# Patient Record
Sex: Male | Born: 1935 | Race: White | Hispanic: No | Marital: Married | Smoking: Former smoker
Health system: Southern US, Community
[De-identification: ages and names within clinical notes are randomized; demographics above are authoritative.]

## PROBLEM LIST (undated history)

## (undated) DIAGNOSIS — F0390 Unspecified dementia without behavioral disturbance: Secondary | ICD-10-CM

## (undated) DIAGNOSIS — J449 Chronic obstructive pulmonary disease, unspecified: Secondary | ICD-10-CM

## (undated) DIAGNOSIS — I714 Abdominal aortic aneurysm, without rupture: Secondary | ICD-10-CM

## (undated) DIAGNOSIS — I4891 Unspecified atrial fibrillation: Secondary | ICD-10-CM

## (undated) DIAGNOSIS — I10 Essential (primary) hypertension: Secondary | ICD-10-CM

## (undated) HISTORY — PX: CHOLECYSTECTOMY: SHX55

## (undated) HISTORY — PX: APPENDECTOMY: SHX54

## (undated) HISTORY — PX: BACK SURGERY: SHX140

## (undated) HISTORY — PX: EYE SURGERY: SHX253

## (undated) HISTORY — PX: KNEE ARTHROSCOPY: SHX127

## (undated) HISTORY — PX: JOINT REPLACEMENT: SHX530

## (undated) HISTORY — PX: EXPLORATION POST OPERATIVE OPEN HEART: SHX5061

## (undated) HISTORY — PX: OTHER SURGICAL HISTORY: SHX169

---

## 1998-02-07 ENCOUNTER — Other Ambulatory Visit: Admission: RE | Admit: 1998-02-07 | Discharge: 1998-02-07 | Payer: Self-pay | Admitting: Interventional Cardiology

## 1998-02-09 ENCOUNTER — Ambulatory Visit (HOSPITAL_COMMUNITY): Admission: RE | Admit: 1998-02-09 | Discharge: 1998-02-09 | Payer: Self-pay | Admitting: Interventional Cardiology

## 1998-11-03 ENCOUNTER — Ambulatory Visit (HOSPITAL_COMMUNITY): Admission: RE | Admit: 1998-11-03 | Discharge: 1998-11-03 | Payer: Self-pay | Admitting: Urology

## 1998-11-03 ENCOUNTER — Encounter: Payer: Self-pay | Admitting: Urology

## 1998-11-05 ENCOUNTER — Emergency Department (HOSPITAL_COMMUNITY): Admission: EM | Admit: 1998-11-05 | Discharge: 1998-11-05 | Payer: Self-pay | Admitting: Emergency Medicine

## 1998-11-05 ENCOUNTER — Encounter: Payer: Self-pay | Admitting: Urology

## 1998-12-08 ENCOUNTER — Ambulatory Visit (HOSPITAL_COMMUNITY): Admission: RE | Admit: 1998-12-08 | Discharge: 1998-12-08 | Payer: Self-pay | Admitting: Urology

## 1998-12-08 ENCOUNTER — Encounter: Payer: Self-pay | Admitting: Urology

## 1999-02-01 ENCOUNTER — Inpatient Hospital Stay (HOSPITAL_COMMUNITY): Admission: AD | Admit: 1999-02-01 | Discharge: 1999-02-07 | Payer: Self-pay | Admitting: Internal Medicine

## 1999-02-01 ENCOUNTER — Encounter: Payer: Self-pay | Admitting: Internal Medicine

## 1999-05-24 ENCOUNTER — Inpatient Hospital Stay (HOSPITAL_COMMUNITY): Admission: AD | Admit: 1999-05-24 | Discharge: 1999-05-30 | Payer: Self-pay | Admitting: *Deleted

## 1999-05-25 ENCOUNTER — Encounter: Payer: Self-pay | Admitting: *Deleted

## 1999-06-02 ENCOUNTER — Encounter: Admission: RE | Admit: 1999-06-02 | Discharge: 1999-08-31 | Payer: Self-pay | Admitting: *Deleted

## 1999-07-04 ENCOUNTER — Encounter (HOSPITAL_COMMUNITY): Admission: RE | Admit: 1999-07-04 | Discharge: 1999-10-02 | Payer: Self-pay | Admitting: Interventional Cardiology

## 1999-08-15 ENCOUNTER — Encounter: Admission: RE | Admit: 1999-08-15 | Discharge: 1999-11-13 | Payer: Self-pay | Admitting: Orthopedic Surgery

## 1999-10-09 ENCOUNTER — Encounter: Payer: Self-pay | Admitting: Emergency Medicine

## 1999-10-09 ENCOUNTER — Inpatient Hospital Stay (HOSPITAL_COMMUNITY): Admission: EM | Admit: 1999-10-09 | Discharge: 1999-10-12 | Payer: Self-pay | Admitting: Emergency Medicine

## 1999-10-11 ENCOUNTER — Encounter: Payer: Self-pay | Admitting: Emergency Medicine

## 1999-10-16 ENCOUNTER — Encounter (HOSPITAL_COMMUNITY): Admission: RE | Admit: 1999-10-16 | Discharge: 2000-01-14 | Payer: Self-pay | Admitting: Interventional Cardiology

## 1999-11-20 ENCOUNTER — Encounter: Admission: RE | Admit: 1999-11-20 | Discharge: 2000-02-18 | Payer: Self-pay | Admitting: Orthopedic Surgery

## 2000-01-15 ENCOUNTER — Encounter (HOSPITAL_COMMUNITY): Admission: RE | Admit: 2000-01-15 | Discharge: 2000-04-14 | Payer: Self-pay | Admitting: Interventional Cardiology

## 2000-02-19 ENCOUNTER — Encounter: Admission: RE | Admit: 2000-02-19 | Discharge: 2000-05-19 | Payer: Self-pay | Admitting: Orthopedic Surgery

## 2000-04-15 ENCOUNTER — Encounter (HOSPITAL_COMMUNITY): Admission: RE | Admit: 2000-04-15 | Discharge: 2000-07-14 | Payer: Self-pay | Admitting: Interventional Cardiology

## 2000-06-04 ENCOUNTER — Encounter: Admission: RE | Admit: 2000-06-04 | Discharge: 2000-09-02 | Payer: Self-pay | Admitting: Orthopedic Surgery

## 2000-07-15 ENCOUNTER — Encounter (HOSPITAL_COMMUNITY): Admission: RE | Admit: 2000-07-15 | Discharge: 2000-10-13 | Payer: Self-pay | Admitting: Interventional Cardiology

## 2000-09-03 ENCOUNTER — Encounter: Admission: RE | Admit: 2000-09-03 | Discharge: 2000-09-05 | Payer: Self-pay | Admitting: Orthopedic Surgery

## 2000-10-14 ENCOUNTER — Encounter (HOSPITAL_COMMUNITY): Admission: RE | Admit: 2000-10-14 | Discharge: 2001-01-12 | Payer: Self-pay | Admitting: Interventional Cardiology

## 2000-12-03 ENCOUNTER — Encounter: Admission: RE | Admit: 2000-12-03 | Discharge: 2000-12-09 | Payer: Self-pay | Admitting: Orthopedic Surgery

## 2001-01-13 ENCOUNTER — Encounter (HOSPITAL_COMMUNITY): Admission: RE | Admit: 2001-01-13 | Discharge: 2001-04-13 | Payer: Self-pay | Admitting: Interventional Cardiology

## 2001-03-11 ENCOUNTER — Encounter: Admission: RE | Admit: 2001-03-11 | Discharge: 2001-03-17 | Payer: Self-pay | Admitting: Orthopedic Surgery

## 2001-04-17 ENCOUNTER — Ambulatory Visit (HOSPITAL_COMMUNITY): Admission: RE | Admit: 2001-04-17 | Discharge: 2001-04-17 | Payer: Self-pay | Admitting: Gastroenterology

## 2001-04-17 ENCOUNTER — Encounter (INDEPENDENT_AMBULATORY_CARE_PROVIDER_SITE_OTHER): Payer: Self-pay | Admitting: *Deleted

## 2001-06-24 ENCOUNTER — Encounter: Admission: RE | Admit: 2001-06-24 | Discharge: 2001-06-30 | Payer: Self-pay | Admitting: Orthopedic Surgery

## 2001-06-30 ENCOUNTER — Encounter (HOSPITAL_COMMUNITY): Admission: RE | Admit: 2001-06-30 | Discharge: 2001-09-28 | Payer: Self-pay | Admitting: Interventional Cardiology

## 2001-10-01 ENCOUNTER — Encounter (HOSPITAL_COMMUNITY): Admission: RE | Admit: 2001-10-01 | Discharge: 2001-12-30 | Payer: Self-pay | Admitting: Interventional Cardiology

## 2001-10-07 ENCOUNTER — Encounter: Admission: RE | Admit: 2001-10-07 | Discharge: 2001-10-13 | Payer: Self-pay | Admitting: Orthopedic Surgery

## 2001-12-31 ENCOUNTER — Encounter (HOSPITAL_COMMUNITY): Admission: RE | Admit: 2001-12-31 | Discharge: 2002-03-31 | Payer: Self-pay | Admitting: Interventional Cardiology

## 2002-01-26 ENCOUNTER — Encounter (HOSPITAL_BASED_OUTPATIENT_CLINIC_OR_DEPARTMENT_OTHER): Admission: RE | Admit: 2002-01-26 | Discharge: 2002-02-13 | Payer: Self-pay | Admitting: Orthopedic Surgery

## 2002-03-03 ENCOUNTER — Encounter: Payer: Self-pay | Admitting: Emergency Medicine

## 2002-03-03 ENCOUNTER — Inpatient Hospital Stay (HOSPITAL_COMMUNITY): Admission: EM | Admit: 2002-03-03 | Discharge: 2002-03-04 | Payer: Self-pay | Admitting: Emergency Medicine

## 2002-04-01 ENCOUNTER — Encounter (HOSPITAL_COMMUNITY): Admission: RE | Admit: 2002-04-01 | Discharge: 2002-06-30 | Payer: Self-pay | Admitting: Interventional Cardiology

## 2002-04-15 ENCOUNTER — Ambulatory Visit (HOSPITAL_BASED_OUTPATIENT_CLINIC_OR_DEPARTMENT_OTHER): Admission: RE | Admit: 2002-04-15 | Discharge: 2002-04-15 | Payer: Self-pay | Admitting: Internal Medicine

## 2002-05-18 ENCOUNTER — Encounter (HOSPITAL_BASED_OUTPATIENT_CLINIC_OR_DEPARTMENT_OTHER): Admission: RE | Admit: 2002-05-18 | Discharge: 2002-05-22 | Payer: Self-pay | Admitting: Internal Medicine

## 2002-06-20 ENCOUNTER — Ambulatory Visit (HOSPITAL_BASED_OUTPATIENT_CLINIC_OR_DEPARTMENT_OTHER): Admission: RE | Admit: 2002-06-20 | Discharge: 2002-06-20 | Payer: Self-pay | Admitting: Internal Medicine

## 2002-07-01 ENCOUNTER — Encounter (HOSPITAL_COMMUNITY): Admission: RE | Admit: 2002-07-01 | Discharge: 2002-09-29 | Payer: Self-pay | Admitting: Interventional Cardiology

## 2002-08-24 ENCOUNTER — Encounter (HOSPITAL_BASED_OUTPATIENT_CLINIC_OR_DEPARTMENT_OTHER): Admission: RE | Admit: 2002-08-24 | Discharge: 2002-11-22 | Payer: Self-pay | Admitting: Internal Medicine

## 2002-10-01 ENCOUNTER — Encounter (HOSPITAL_COMMUNITY): Admission: RE | Admit: 2002-10-01 | Discharge: 2002-12-30 | Payer: Self-pay | Admitting: Interventional Cardiology

## 2002-11-30 ENCOUNTER — Encounter (HOSPITAL_BASED_OUTPATIENT_CLINIC_OR_DEPARTMENT_OTHER): Admission: RE | Admit: 2002-11-30 | Discharge: 2003-02-28 | Payer: Self-pay | Admitting: Internal Medicine

## 2002-12-31 ENCOUNTER — Encounter (HOSPITAL_COMMUNITY): Admission: RE | Admit: 2002-12-31 | Discharge: 2003-03-31 | Payer: Self-pay | Admitting: Interventional Cardiology

## 2003-01-05 ENCOUNTER — Encounter: Admission: RE | Admit: 2003-01-05 | Discharge: 2003-01-14 | Payer: Self-pay | Admitting: Orthopedic Surgery

## 2003-02-17 ENCOUNTER — Inpatient Hospital Stay (HOSPITAL_COMMUNITY): Admission: EM | Admit: 2003-02-17 | Discharge: 2003-02-18 | Payer: Self-pay

## 2003-02-18 ENCOUNTER — Encounter: Payer: Self-pay | Admitting: Interventional Cardiology

## 2003-03-04 ENCOUNTER — Encounter (HOSPITAL_BASED_OUTPATIENT_CLINIC_OR_DEPARTMENT_OTHER): Admission: RE | Admit: 2003-03-04 | Discharge: 2003-06-02 | Payer: Self-pay | Admitting: Internal Medicine

## 2003-04-01 ENCOUNTER — Encounter (HOSPITAL_COMMUNITY): Admission: RE | Admit: 2003-04-01 | Discharge: 2003-06-30 | Payer: Self-pay | Admitting: Interventional Cardiology

## 2003-06-10 ENCOUNTER — Encounter (HOSPITAL_BASED_OUTPATIENT_CLINIC_OR_DEPARTMENT_OTHER): Admission: RE | Admit: 2003-06-10 | Discharge: 2003-06-23 | Payer: Self-pay | Admitting: Internal Medicine

## 2003-07-02 ENCOUNTER — Encounter (HOSPITAL_COMMUNITY): Admission: RE | Admit: 2003-07-02 | Discharge: 2003-09-30 | Payer: Self-pay | Admitting: Interventional Cardiology

## 2003-10-02 ENCOUNTER — Encounter (HOSPITAL_COMMUNITY): Admission: RE | Admit: 2003-10-02 | Discharge: 2003-11-30 | Payer: Self-pay | Admitting: Interventional Cardiology

## 2003-10-07 ENCOUNTER — Encounter (HOSPITAL_BASED_OUTPATIENT_CLINIC_OR_DEPARTMENT_OTHER): Admission: RE | Admit: 2003-10-07 | Discharge: 2003-10-19 | Payer: Self-pay | Admitting: Internal Medicine

## 2004-01-11 ENCOUNTER — Encounter (HOSPITAL_BASED_OUTPATIENT_CLINIC_OR_DEPARTMENT_OTHER): Admission: RE | Admit: 2004-01-11 | Discharge: 2004-01-25 | Payer: Self-pay | Admitting: Internal Medicine

## 2004-02-04 ENCOUNTER — Emergency Department (HOSPITAL_COMMUNITY): Admission: EM | Admit: 2004-02-04 | Discharge: 2004-02-04 | Payer: Self-pay | Admitting: Emergency Medicine

## 2004-02-29 ENCOUNTER — Inpatient Hospital Stay (HOSPITAL_BASED_OUTPATIENT_CLINIC_OR_DEPARTMENT_OTHER): Admission: RE | Admit: 2004-02-29 | Discharge: 2004-02-29 | Payer: Self-pay | Admitting: Interventional Cardiology

## 2004-04-19 ENCOUNTER — Encounter (HOSPITAL_BASED_OUTPATIENT_CLINIC_OR_DEPARTMENT_OTHER): Admission: RE | Admit: 2004-04-19 | Discharge: 2004-05-05 | Payer: Self-pay | Admitting: Internal Medicine

## 2004-07-26 ENCOUNTER — Encounter (HOSPITAL_BASED_OUTPATIENT_CLINIC_OR_DEPARTMENT_OTHER): Admission: RE | Admit: 2004-07-26 | Discharge: 2004-08-11 | Payer: Self-pay | Admitting: Internal Medicine

## 2004-11-07 ENCOUNTER — Encounter (HOSPITAL_BASED_OUTPATIENT_CLINIC_OR_DEPARTMENT_OTHER): Admission: RE | Admit: 2004-11-07 | Discharge: 2004-11-29 | Payer: Self-pay | Admitting: Internal Medicine

## 2005-06-13 ENCOUNTER — Encounter: Admission: RE | Admit: 2005-06-13 | Discharge: 2005-06-13 | Payer: Self-pay | Admitting: Internal Medicine

## 2005-06-29 ENCOUNTER — Ambulatory Visit (HOSPITAL_COMMUNITY): Admission: RE | Admit: 2005-06-29 | Discharge: 2005-06-29 | Payer: Self-pay | Admitting: General Surgery

## 2005-07-02 ENCOUNTER — Ambulatory Visit (HOSPITAL_COMMUNITY): Admission: RE | Admit: 2005-07-02 | Discharge: 2005-07-02 | Payer: Self-pay | Admitting: General Surgery

## 2005-09-25 ENCOUNTER — Emergency Department (HOSPITAL_COMMUNITY): Admission: EM | Admit: 2005-09-25 | Discharge: 2005-09-25 | Payer: Self-pay | Admitting: Emergency Medicine

## 2005-10-11 ENCOUNTER — Emergency Department (HOSPITAL_COMMUNITY): Admission: EM | Admit: 2005-10-11 | Discharge: 2005-10-11 | Payer: Self-pay | Admitting: Emergency Medicine

## 2005-10-22 ENCOUNTER — Inpatient Hospital Stay (HOSPITAL_COMMUNITY): Admission: RE | Admit: 2005-10-22 | Discharge: 2005-10-23 | Payer: Self-pay | Admitting: General Surgery

## 2005-10-22 ENCOUNTER — Encounter (INDEPENDENT_AMBULATORY_CARE_PROVIDER_SITE_OTHER): Payer: Self-pay | Admitting: *Deleted

## 2006-09-11 ENCOUNTER — Ambulatory Visit (HOSPITAL_BASED_OUTPATIENT_CLINIC_OR_DEPARTMENT_OTHER): Admission: RE | Admit: 2006-09-11 | Discharge: 2006-09-11 | Payer: Self-pay | Admitting: Orthopedic Surgery

## 2006-09-27 IMAGING — CR DG CHEST 1V PORT
1 series · 1 of 1 positions shown · non-contrast
Comparison: 02/04/04.

CLINICAL DATA: Chest pain, short of breath. 
 PORTABLE CHEST ? 1 VIEW:

[view not recorded]
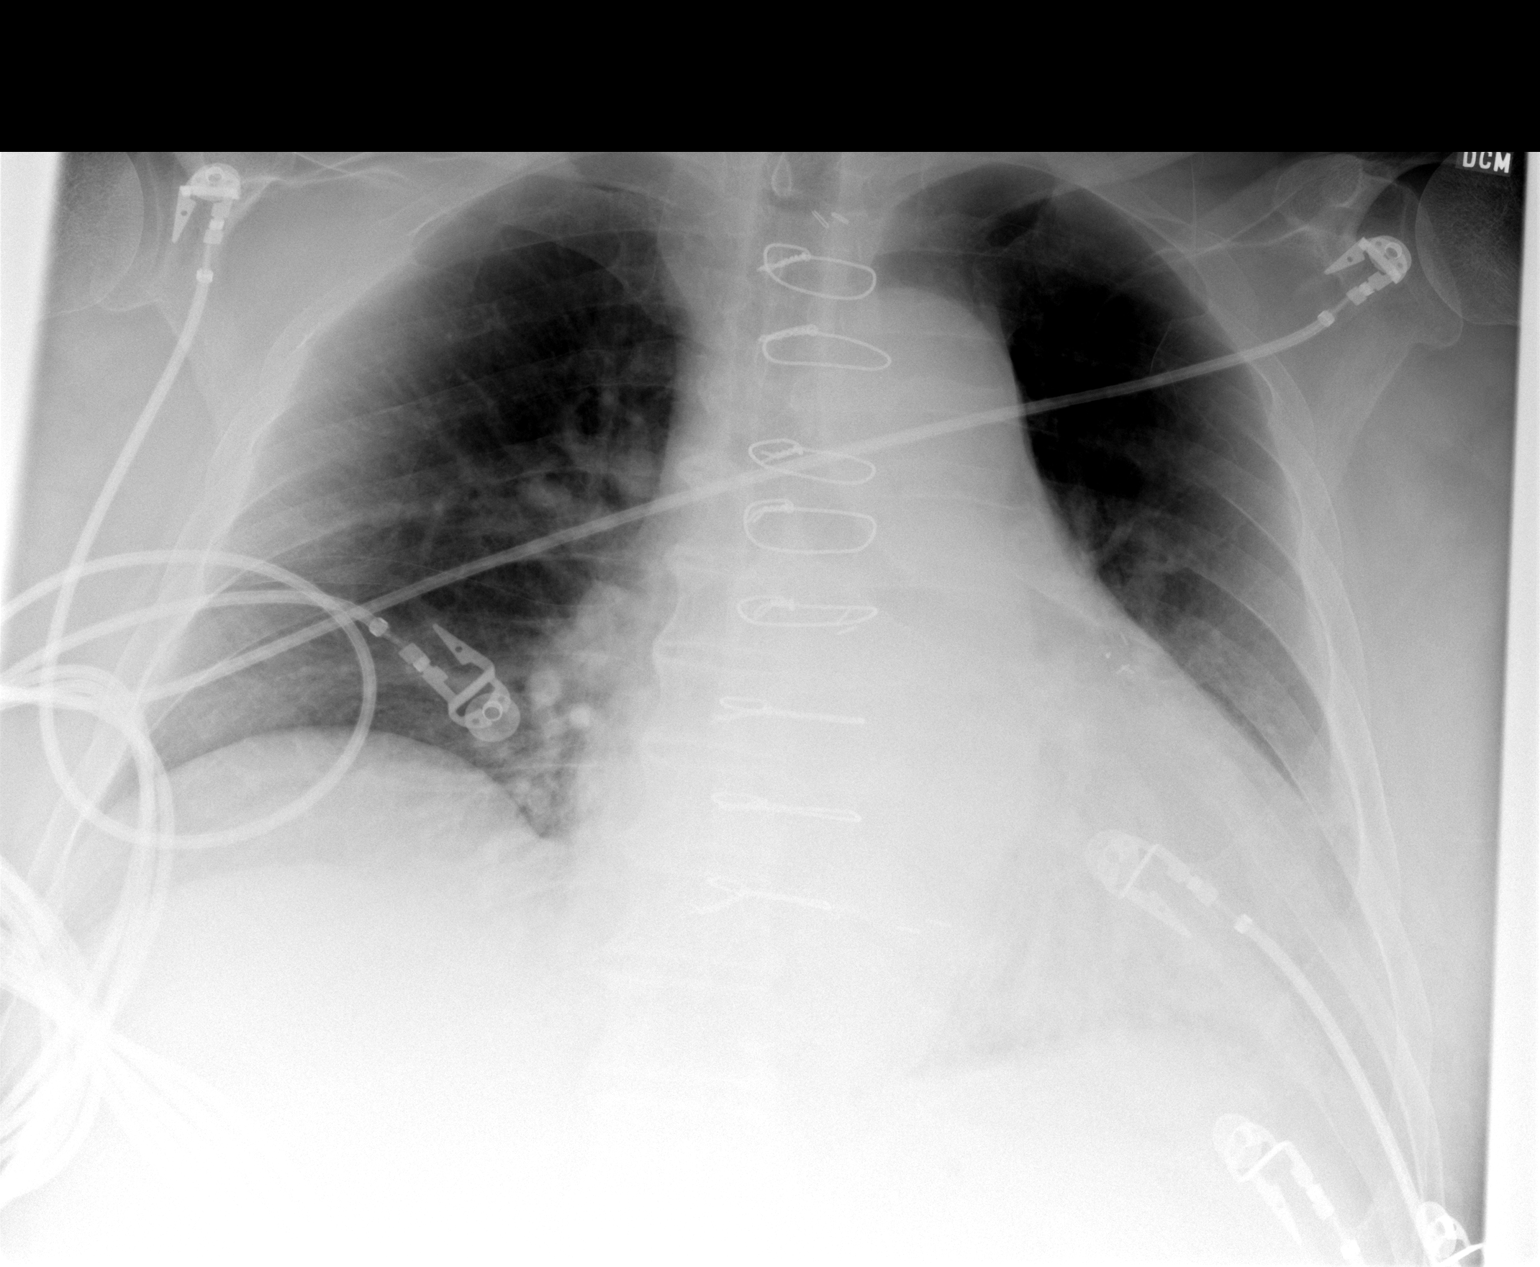

[1 of 1 positions shown; findings below may reference images not displayed]

FINDINGS: The heart is enlarged.  There may be venous hypertension, but no frank congestive heart failure.  Cannot rule out left lower lobe airspace disease seen through the left heart shadow.
IMPRESSION: 1.  Cardiomegaly with pulmonary venous hypertension.
 2.  Cannot rule out left lower lobe pneumonia.

## 2006-10-08 IMAGING — RF DG CHOLANGIOGRAM OPERATIVE
1 series · 4 of 4 positions shown · non-contrast
Comparison: None.

CLINICAL DATA: Cholecystitis. Laparoscopic cholecystectomy.

INTRAOPERATIVE CHOLANGIOGRAPHY 10/22/2005:

[Series 1: run · 4 of 131 frames shown]
[frame 20/131]
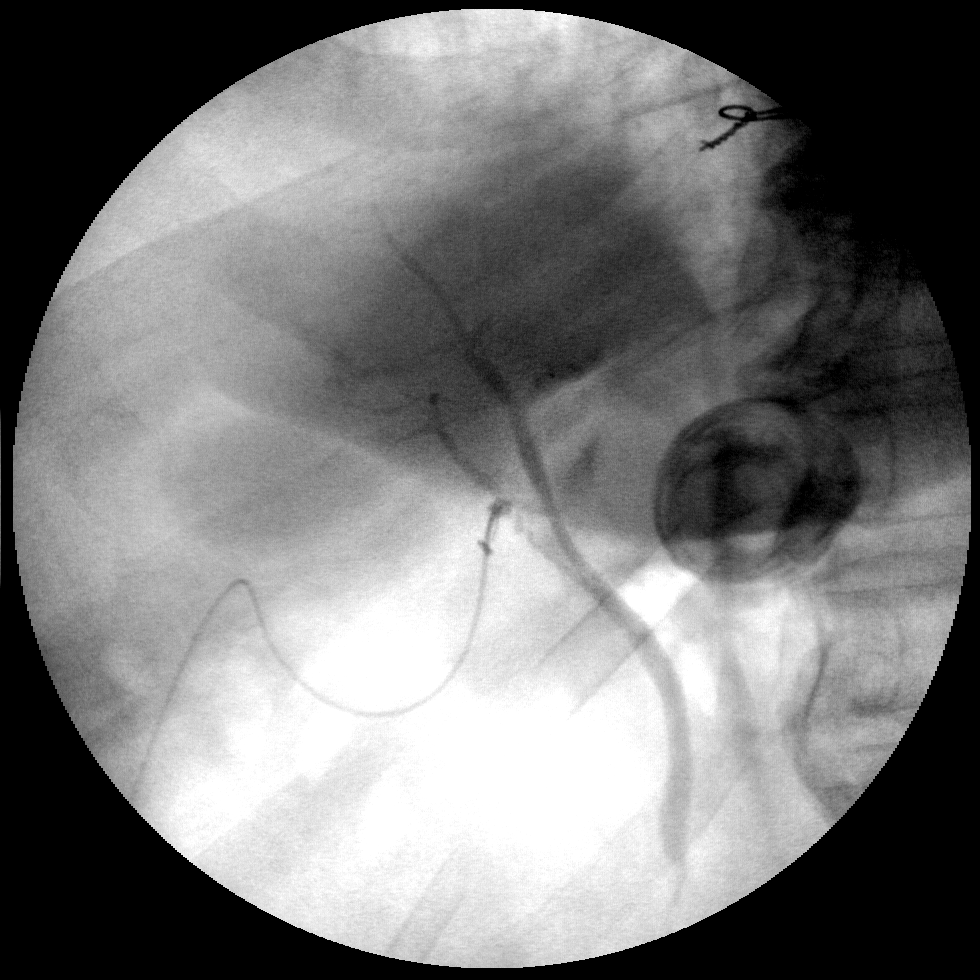
[frame 66/131]
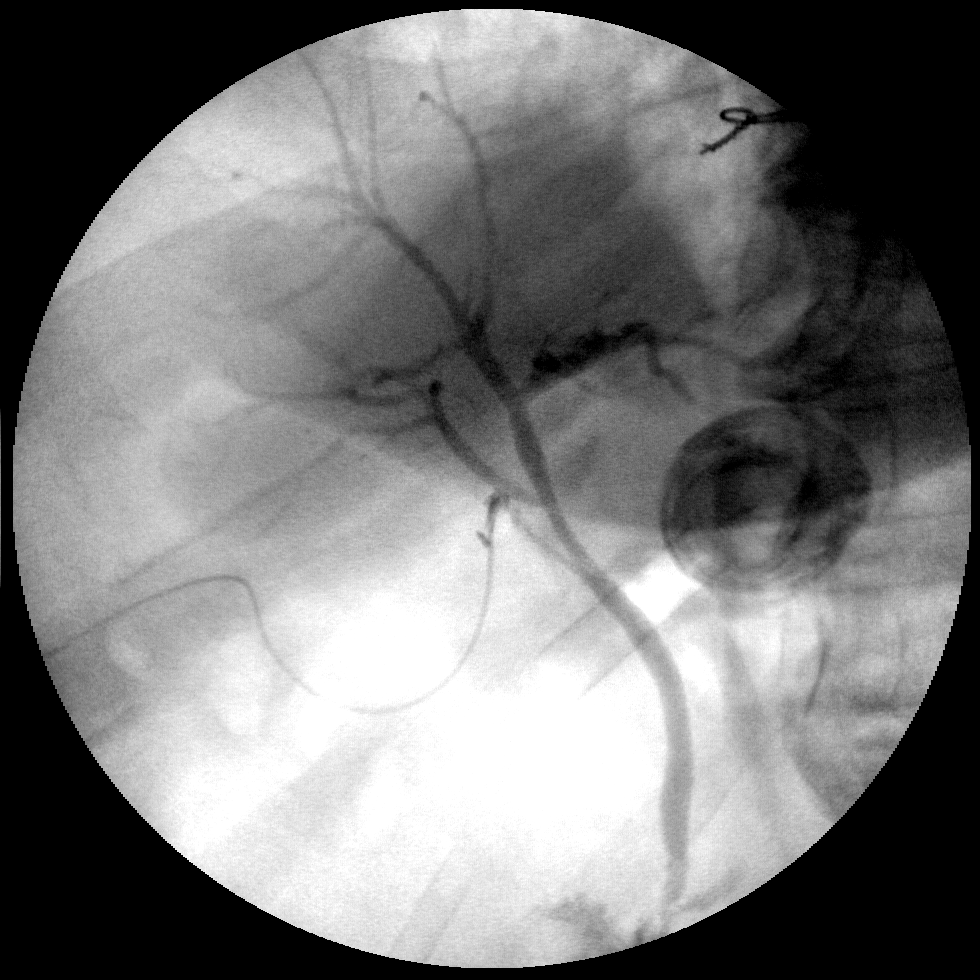
[frame 111/131]
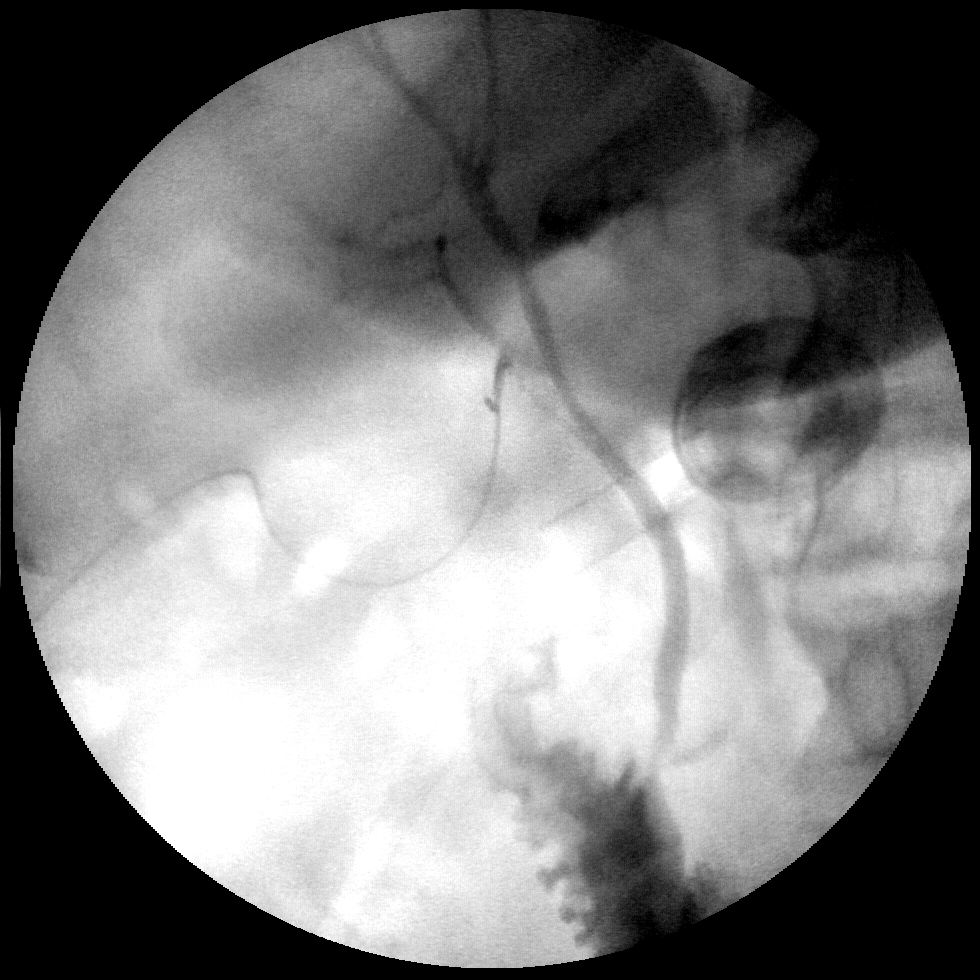
[frame 112/131]
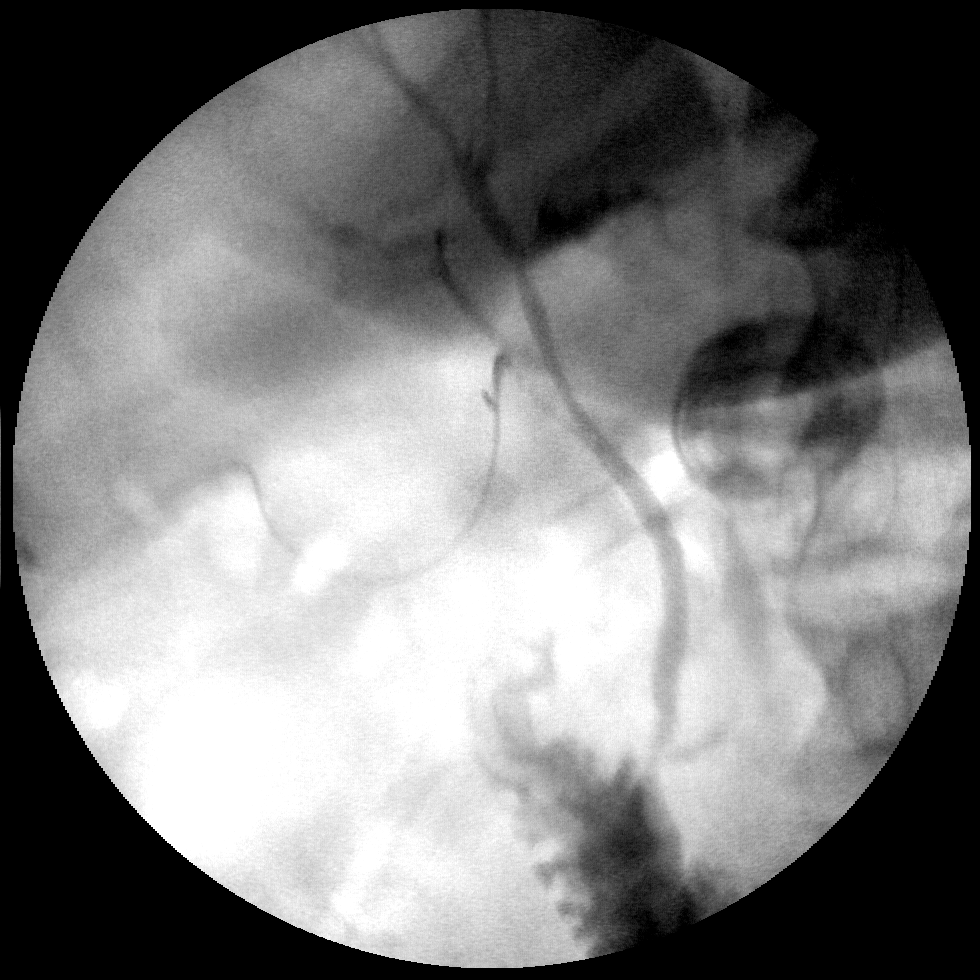

[4 of 4 positions shown; findings below may reference images not displayed]

FINDINGS: Multiple images from the C-arm fluoroscopic device are submitted for
interpretation postoperatively. There is a cannula in the cystic duct remnant.
There is excellent opacification of the common bile duct, common hepatic duct,
and proximal intrahepatic ducts. No filling defects are identified to suggest
retained stones. There is excellent antegrade flow into the duodenum.There is
variant bile duct anatomy in that the left intrahepatic ducts enter the right
anterior intrahepatic duct. The right posterior intrahepatic duct enters more
inferiorly in the common hepatic duct.
IMPRESSION: 1. No retained bile duct stones.
2. Variant bile duct anatomy as described.

## 2009-03-13 ENCOUNTER — Inpatient Hospital Stay (HOSPITAL_COMMUNITY): Admission: EM | Admit: 2009-03-13 | Discharge: 2009-03-15 | Payer: Self-pay | Admitting: Emergency Medicine

## 2010-10-22 ENCOUNTER — Encounter: Payer: Self-pay | Admitting: Internal Medicine

## 2011-01-08 LAB — GLUCOSE, CAPILLARY
Glucose-Capillary: 108 mg/dL — ABNORMAL HIGH (ref 70–99)
Glucose-Capillary: 109 mg/dL — ABNORMAL HIGH (ref 70–99)
Glucose-Capillary: 119 mg/dL — ABNORMAL HIGH (ref 70–99)
Glucose-Capillary: 124 mg/dL — ABNORMAL HIGH (ref 70–99)
Glucose-Capillary: 149 mg/dL — ABNORMAL HIGH (ref 70–99)
Glucose-Capillary: 174 mg/dL — ABNORMAL HIGH (ref 70–99)
Glucose-Capillary: 178 mg/dL — ABNORMAL HIGH (ref 70–99)

## 2011-01-08 LAB — CBC
HCT: 35.2 % — ABNORMAL LOW (ref 39.0–52.0)
Hemoglobin: 10.9 g/dL — ABNORMAL LOW (ref 13.0–17.0)
Hemoglobin: 11.3 g/dL — ABNORMAL LOW (ref 13.0–17.0)
MCHC: 32.2 g/dL (ref 30.0–36.0)
MCV: 83.7 fL (ref 78.0–100.0)
Platelets: 145 10*3/uL — ABNORMAL LOW (ref 150–400)
RBC: 4.2 MIL/uL — ABNORMAL LOW (ref 4.22–5.81)
RDW: 18.1 % — ABNORMAL HIGH (ref 11.5–15.5)
WBC: 6.4 10*3/uL (ref 4.0–10.5)
WBC: 9.4 10*3/uL (ref 4.0–10.5)

## 2011-01-08 LAB — DIFFERENTIAL
Eosinophils Relative: 1 % (ref 0–5)
Lymphocytes Relative: 10 % — ABNORMAL LOW (ref 12–46)
Monocytes Absolute: 0.6 10*3/uL (ref 0.1–1.0)
Monocytes Relative: 7 % (ref 3–12)
Neutrophils Relative %: 83 % — ABNORMAL HIGH (ref 43–77)

## 2011-01-08 LAB — BASIC METABOLIC PANEL
BUN: 13 mg/dL (ref 6–23)
CO2: 34 mEq/L — ABNORMAL HIGH (ref 19–32)
Chloride: 100 mEq/L (ref 96–112)
Chloride: 98 mEq/L (ref 96–112)
Creatinine, Ser: 1.04 mg/dL (ref 0.4–1.5)
GFR calc Af Amer: >60 mL/min (ref >60)
Glucose, Bld: 113 mg/dL — ABNORMAL HIGH (ref 70–99)
Sodium: 139 mEq/L (ref 135–145)

## 2011-01-08 LAB — URINALYSIS, ROUTINE W REFLEX MICROSCOPIC
Bilirubin Urine: NEGATIVE
Glucose, UA: NEGATIVE mg/dL
Hgb urine dipstick: NEGATIVE
Nitrite: NEGATIVE
Protein, ur: NEGATIVE mg/dL
Urobilinogen, UA: 1 mg/dL (ref 0.0–1.0)

## 2011-01-08 LAB — COMPREHENSIVE METABOLIC PANEL
Alkaline Phosphatase: 61 U/L (ref 39–117)
BUN: 13 mg/dL (ref 6–23)
CO2: 31 mEq/L (ref 19–32)
Calcium: 8 mg/dL — ABNORMAL LOW (ref 8.4–10.5)
Glucose, Bld: 140 mg/dL — ABNORMAL HIGH (ref 70–99)
Sodium: 137 mEq/L (ref 135–145)
Total Protein: 6.3 g/dL (ref 6.0–8.3)

## 2011-01-08 LAB — URINE CULTURE

## 2011-01-08 LAB — CULTURE, BLOOD (ROUTINE X 2): Culture: NO GROWTH

## 2011-01-08 LAB — HEMOGLOBIN A1C: Hgb A1c MFr Bld: 6.6 % — ABNORMAL HIGH (ref 4.6–6.1)

## 2011-02-13 NOTE — H&P (Signed)
NAME:  Glen Miller, Glen Miller NO.:  [id number]   MEDICAL RECORD NO.:  [id number]          PATIENT TYPE:  INP   LOCATION:  5011                         FACILITY:  [facility]   PHYSICIAN:  Hollice Espy, M.D.DATE OF BIRTH:  12-23-1927   DATE OF ADMISSION:  03/12/2009  DATE OF DISCHARGE:                              HISTORY & PHYSICAL   PRIMARY CARE PHYSICIAN:  Georgann Housekeeper, MD.   CARDIOLOGIST:  Lyn Records, M.D.   CHIEF COMPLAINT:  Shortness of breath and cough.   HISTORY OF PRESENT ILLNESS:  The patient is a 77 year old white male  with a past medical history of COPD, CHF, CAD and diabetes mellitus who  for the past 4 to 5 days has been having problems, complaining of a  nonproductive cough, worsening shortness of breath and weakness.  When  he came to the emergency room, he was noted to have a white count 9.4,  however with an 83% shift.  The rest of his labs were noted for a normal  BUN and creatinine, a glucose of 140 and albumin 2.8 and a temperature  of 102.4.  A chest x-ray was done which noted cardiomegaly, no signs of  congestive heart failure and questionable atelectasis versus of  infectious process in the right infrahilar region.  Because of the  patient's elevated temperatures, I suspected he had a pneumonia and he  was given a dose of IV Rocephin and Zithromax in the emergency room.  A  BNP was not checked. When I saw the patient, he was feeling better.  He  was still complaining of some shortness of breath and weakness.  Denied  any headaches, vision changes or dysphagia, no chest pain or  palpitations, no abdominal pain.  No hematuria, dysuria, constipation,  diarrhea focal extremity numbness, weakness or pain.  Complains of  severe chronic lower extremity swelling.   REVIEW OF SYSTEMS:  Otherwise negative.   PAST MEDICAL HISTORY:  1. History of COPD.  2. Diabetes mellitus.  3. GERD.  4. CAD.  5. CHF with preserved ejection fraction.  6. Early  signs of dementia.  7. Depression.  8. Peripheral neuropathy.   MEDICATIONS:  1. Norvasc 10 mg.  2. Vytorin 10/80 mg.  3. Aspirin 81 mg.  4. Spiriva 18 mcg inhaled daily.  5. Imdur 60 mg.  6. Hyzaar 100/25 mg.  7. Metformin 1000 mg b.i.d.  8. Toprol XL 25 mg.  9. Flomax 0.4 mg.  10.Namenda 8 mg.  11.Neurontin 400 mg.  12.Fluoxetine 20 mg.  13.Zyrtec 10 mg.  14.K-Dur 20 mg.  15.Prilosec 20 mg.   ALLERGIES:  No known drug allergies.   SOCIAL HISTORY:  No no current tobacco, alcohol or drug use.   FAMILY HISTORY:  Noncontributory.   PHYSICAL EXAMINATION:  VITALS:  On admission temperature 102.4, heart  rate 65, blood pressure 137/62, respirations 22, O2 saturation 95% on 5  liters, now down to 92% on 2 liters .  GENERAL:  He is alert and oriented x2.  HEENT: Normocephalic atraumatic.  Mucous membranes slightly dry.  He has  no carotid  bruits.  HEART:  Regular rate and rhythm, normal S1, S2.  LUNGS:  Decreased breath sounds throughout.  ABDOMEN:  Soft, nontender, obese, positive bowel sounds.  EXTREMITIES:  No clubbing, cyanosis, he is a 2+ pitting edema from the  knees down.   LABORATORY DATA:  White count 9.4, H and H 11 and 35, MCV 84, platelet  count 180, 83% neutrophils.  Urinalysis unremarkable.  Urine cultures,  blood cultures, BNP pending.  Sodium 137, potassium 3.7 chloride 98,  bicarb 31, BUN 13, creatinine 1, glucose 140.  LFTs are noted for  albumin of 2.8.  Chest x-ray notes suboptimal exam, limitation of left  lung base visually, cardiomegaly, no signs of CHF,  volume loss and  opacity in the right infrahilar region atelectasis versus early  infection.   ASSESSMENT AND PLAN:  1. Chronic obstructive pulmonary disease/pneumonia.  Treat with IV      antibiotics, oxygen, nebulizers.  No steroids indicated at this      time.  2. Diabetes mellitus.  Continue Lantus sliding scale.  3. Previous history of coronary artery disease.  Check a BNP, rule out       diastolic acute dysfunction.      Hollice Espy, M.D.  Electronically Signed     SKK/MEDQ  D:  03/13/2009  T:  03/13/2009  Job:  [id number]   cc:   Georgann Housekeeper, MD

## 2011-02-13 NOTE — Discharge Summary (Signed)
NAMESHELDON, SEM NO.:  [id number]   MEDICAL RECORD NO.:  [id number]          PATIENT TYPE:  INP   LOCATION:  5011                         FACILITY:  [facility]   PHYSICIAN:  Glen Housekeeper, MD      DATE OF BIRTH:  [birth year]   DATE OF ADMISSION:  03/12/2009  DATE OF DISCHARGE:  03/15/2009                               DISCHARGE SUMMARY   DISCHARGE DIAGNOSES:  1. Left lower lobe pneumonia.  2. Mild congestive heart failure.  3. History of coronary artery disease.  4. History of chronic obstructive pulmonary disease, oxygen dependent.  5. Hypertension.  6. Depression.  7. Osteoarthritis, bilateral knee pain.  8. Weakness.  9. Diabetes.  10.Neuropathy.  11.Sleep apnea.   MEDICATIONS ON DISCHARGE:  1. Cozaar 50 mg daily.  2. Spiriva once a day inhaler.  3. Lidoderm pain patch q.12 hours as needed.  4. Prozac 20 mg daily.  5. Vitamin B12 injections every month.  6. Mobic 7.5 mg daily.  7. Calcium 500 two times a day.  8. Magnesium 400 mg once a day.  9. Albuterol nebulizer 3-4 times a day as needed.  10.Toprol 25 mg 2 tablets daily.  11.Flomax 0.4 mg daily.  12.Neurontin 400 mg 3 times a day.  13.Vytorin 10/80 one daily.  14.Prilosec 20 mg daily.  15.Imdur 60 mg daily.  16.Zyrtec 10 mg daily p.r.n.  17.Norvasc 10 mg half a tablet daily.  18.Potassium 20 mEq 2 tablets daily.  19.Lasix 40 mg daily.  20.CPAP at night.  21.Oxygen 2-3 L nasal cannula.  22.Ventolin MDI as needed.  23.Avelox 400 mg once a day for 10 days.  24.Lantus insulin 20 units at night.  25.Namenda 5 mg b.i.d.  26.Darvocet 100 q.6 p.r.n. for pain.   MEDICATIONS STOPPED:  Hyzaar and Avandia.   LABORATORY DATA:  Potassium 3.4, sodium 139, creatinine 1.05.  White  count of 6.4, hemoglobin 10.9.  The blood cultures remained negative.  BNP initially was 980, came down to 650.  A1c 6.6.  Chest x-ray showed  left lower lobe pneumonia, mild vascular congestion, COPD.   HOSPITAL COURSE:   A 77 year old male with multiple medical issues,  admitted after he came in with some shortness of breath and congestion  with elevated white count, and the patient was at vacation for about a  week when he got sick.   PROBLEMS:  1. Pulmonary.  The patient had left lower lobe infiltrate, started on      antibiotics, Avelox IV.  His white count came down to 6.4, switched      to p.o. Avelox for total of 10 days.  His oxygen remained stable.      He has history of severe chronic obstructive pulmonary disease,      oxygen dependent, continues to require oxygen and nebulizer and      Spiriva, continue on that.  2. Cardiac.  The patient with history of coronary artery disease, had      mildly elevated BNP, given IV Lasix with improvement.  Lasix was      increased to 40 mg  daily from 20 and potassium replacement.  His      Avandia will be discontinued because of his mild congestive heart      failure decrease.  Increase the Lantus insulin and continue on the      metformin, and as far as his cardiac markers were negative, he      remained in normal sinus rhythm on telemetry.  3. Type 2 diabetes.  He was on metformin, Avandia, and Lantus.  We      will discontinue the Avandia because of congestive heart failure.      Continue metformin.  Increase Lantus to 20 units.  4. Hypertension.  We will stop the Hyzaar because of the Lasix      increasing and switch him to Cozaar 50 mg and decrease the Norvasc      to 5 mg and Lasix 40 mg daily.  As far as his neuropathy, continue      on his Neurontin.  Depression stable on Prozac.  I will see him      back in 1 week at the office.  As far as his discomfort in the knee      and with difficulty ambulation, the patient had osteoarthritis on      the x-ray of the left knee.  Continue on Mobic as well as we will      add Darvocet p.r.n. for pain.  Physical Therapy was consulted.      They recommended getting a short-term SNF versus Home Health with       physical therapy, and family decided to go with Home Health, and      physical therapy at home will be arranged, and we will monitor his      progress.  I will see him back in 1 week.      Glen Housekeeper, MD  Electronically Signed     KH/MEDQ  D:  03/15/2009  T:  03/15/2009  Job:  [id number]

## 2011-02-16 NOTE — Procedures (Signed)
[facility]  Patient:    JASPAL, PULTZ                         MRN: [id number] Proc. Date: 04/17/01 Attending:  Verlin Grills, M.D. CC:         Tyson Dense, M.D.   Procedure Report  PROCEDURE PERFORMED:  Surveillance colonoscopy with polypectomy.  ENDOSCOPIST:  Verlin Grills, M.D.  INDICATIONS:  Mr. Egor Fullilove (date of birth November 22, 2027) is a 77 year old male who is due for his first surveillance colonoscopy with polypectomy to prevent colon cancer.  I discussed with Mr. Ewbank the complications associated with colonoscopy and polypectomy including a 15 per 1000 risk of bleeding and 4 per 1000 risk of colon rupture requiring emergency surgery.  Mr. Tierce has signed the operative permit.  PREMEDICATIONS:  Versed 7 mg and Demerol 50 mg.  ENDOSCOPE:  Olympus pediatric colonoscope.  PROCEDURE PERFORMED:  After obtaining informed consent, Mr. Phillis was placed in the left lateral decubitus position.  I administered intravenous Versed and intravenous Demerol to achieve conscious sedation for the procedure.  The patients blood pressure, oxygen saturation, and cardiac rhythm were monitored throughout the procedure and documented in his medical record.  Anal inspection was normal.  Digital rectal exam was normal.  I did not get an adequate exam of the prostate.  The Olympus pediatric video colonoscope was introduced into the rectum and advanced to the cecum.  A normal appearing ileocecal valve was intubated and the distal ileum inspected.  Colonic preparation for the exam today was excellent.  From the mid rectum, a 1 mm sessile polyp was removed with hot biopsy forceps.  Sigmoid colon and descending colon showed extensive left colonic diverticulosis.  Splenic flexure normal.  Transverse colon from the mid transverse colon a 0.5 mm sessile polyp was removed with the cold biopsy forceps.  Hepatic flexure normal.  Ascending colon normal.   Cecum and ileocecal valve normal.  Distal ileum normal.  ASSESSMENT: 1. Extensive left colonic diverticulitis. 2. From the mid rectum, a 0.5 mm sessile polyp was removed with    cold biopsy forceps.  From the mid rectum, a 1 mm sessile    polyp was removed with the hot biopsy forceps.  The polyps    were submitted in one bottle for pathological evaluation.  RECOMMENDATIONS:  If colon polyps return neoplastic, Mr. Murtha should undergo a repeat colonoscopy in approximately five years.  If the polyps return nonneoplastic, Mr. Prehn should undergo a repeat surveillance colonoscopy in approximately 10 years. DD:  04/17/01 TD:  04/17/01 Job: [id number]

## 2011-02-16 NOTE — Op Note (Signed)
NAME:  Glen Miller, Glen Miller                ACCOUNT NO.:  [id number]   MEDICAL RECORD NO.:  [id number]          PATIENT TYPE:  INP   LOCATION:  1616                         FACILITY:  [facility]   PHYSICIAN:  Timothy E. Earlene Plater, M.D. DATE OF BIRTH:  05/11/1928   DATE OF PROCEDURE:  10/22/2005  DATE OF DISCHARGE:                                 OPERATIVE REPORT   PREOPERATIVE DIAGNOSES:  Symptomatic chronic cholecystolithiasis, known  ventral hernia.   POSTOPERATIVE DIAGNOSES:  Symptomatic chronic cholecystolithiasis, known  ventral hernia.   PROCEDURE:  Laparoscopic cholecystectomy with operative cholangiogram.   SURGEON:  Timothy E. Earlene Plater, M.D.   ASSISTANT:  Ollen Gross. Carolynne Edouard, M.D.   ANESTHESIA:  General.   Mr. Babington is under the constant care of Dr. Donette Larry, Dr. Katrinka Blazing, Dr.  Patsi Sears for known pulmonary cardiac diabetes care. He presents with well  known gallstones and increasing symptoms. He was also found to have a  ventral hernia that was thought to be primary. After careful consideration,  cardiac evaluation, and because of symptomatic gallstones as well as  intermittently elevated liver function studies, he is ready to proceed with  his surgery. He was seen and evaluated in the preop area, he was identified,  permit signed.   The patient was taken to the operating room, placed supine, general  endotracheal anesthesia administered. The abdomen was clipped, prepped and  draped in the usual fashion. Marcaine 0.25% with epinephrine was used prior  to each incision. A vertical incision made below the umbilicus, fascia  identified and the peritoneum entered without complications. The hernia felt  to be superior to the umbilicus. Hasson catheter placed, tied in place and  abdomen insufflated. Gentle peritoneoscopy was unremarkable except for  omental adhesions to the gallbladder and omentum incarcerated in a primary  supraumbilical hernia. A second 10 mm gastric trocar placed in the  epigastrium and two 5 mm trocars in the right upper quadrant. The  gallbladder was grasped, the omental adhesions taken down bluntly and the  gallbladder inspected in its entirety. Careful dissection at the base of the  gallbladder revealed a single apparent cystic duct and a single apparent  cystic artery. These were dissected free, a complete window created, a clip  placed at the gallbladder side of the cystic duct, cystic duct opened and  catheter placed percutaneously and a real-time cholangiogram made using half  strength Hypaque. There was immediate filling of the biliary tree with quick  emptying of dye into the duodenum. We inspected that carefully and thought  it was normal. We then reinflated the abdomen, visualized everything,  retracted the gallbladder, removed the clip, removed the catheter and then  triply clipped the stump of the cystic duct, divided it, triply clipped the  artery, divided it and then removed the gallbladder from the gallbladder bed  without incident or complications. Some bile spilled at the very top of the  gallbladder. No stones were seen to be spilled. The gallbladder was placed  in an EndoCatch bag and then copious irrigation carried out, thorough  inspection of the gallbladder bed. No complications were noted. We  were able  to visualize the omentum incarcerated in the primary supraumbilical hernia  from the upper 10 mm scope. I could not reduce the incarcerated omentum and  we decided that because of the type case of the gallbladder, i.e. dirty  case, and a diabetic that we would not make a separate incision, that it  could not be repaired laparoscopically and we could not use mesh. So we  elected not to repair this hernia at this time as it is apparently  asymptomatic. All counts were correct,  irrigant clear, CO2 and irrigant removed. Instruments and trocars removed  under direct vision. Each wound inspected and closed with 3-0 Monocryl.  Steri-Strips  applied. Final counts correct. He tolerated it well and was  removed to the recovery room in good condition.      Timothy E. Earlene Plater, M.D.  Electronically Signed     TED/MEDQ  D:  10/22/2005  T:  10/22/2005  Job:  [id number]

## 2011-02-16 NOTE — Cardiovascular Report (Signed)
NAME:  Glen Miller, Glen Miller NO.:  [id number]   MEDICAL RECORD NO.:  [id number]                   PATIENT TYPE:  OIB   LOCATION:  6501                                 FACILITY:  [facility]   PHYSICIAN:  Lesleigh Noe, M.D.            DATE OF BIRTH:  09/09/1928   DATE OF PROCEDURE:  02/29/2004  DATE OF DISCHARGE:                              CARDIAC CATHETERIZATION   INDICATION FOR PROCEDURE:  Recent increasing exertional symptoms of dyspnea,  fatigue and some chest tightness.  Recent Cardiolite study on Feb 14, 2004,  demonstrated a subtle anteroapical ischemia, probable mild inferior wall  ischemia, EF 59%.  This study is being done to define coronary anatomy.   PROCEDURE PERFORMED:  1. Left heart catheterization.  2. Selective coronary angiogram.  3. Left ventriculography.  4. Saphenous vein graft angiography.  5. Internal mammary graft angiography.   DESCRIPTION:  After informed consent, a 5 French sheath was placed in the  right femoral artery using a modified Seldinger technique.  A 5 French B2  multipurpose catheter was used for hemodynamic recordings, left  ventriculography by hand injection, selective left and right coronary  angiography and saphenous vein graft angiography.  A 5 French internal  mammary catheter was used for left internal mammary artery angiography.  The  patient tolerated the procedure without significant complication.   RESULTS:  A. Hemodynamic data:     a. Aortic pressure 122/63.     b. Left ventricular pressure 123/7 mmHg.   A. Left ventriculography:  Left ventricle is normal in size and demonstrates normal overall  contractility.  EF is 60%, no MR.   A. Coronary angiography:     a. Left main coronary:  Free of any significant obstruction.     b. Left anterior descending coronary:  Totally occluded proximally after        the first septal perforator.  The remainder of the LAD is supplied by        a saphenous vein  graft to the diagonal and LIMA to the LAD.     c. Circumflex artery:  The circumflex coronary artery is the dominant        vessel.  It gives rise to several large obtuse marginal branches.  The        first obtuse marginal branch fiburcates on the high lateral wall.  The        second obtuse marginal is relatively small.  The third obtuse marginal        is relatively small.  The fourth obtuse marginal is large.  The fifth        obtuse marginal bifurcates from the inferolateral wall.  There is 50%        narrowing after the second obtuse marginal.  The distal circumflex        supplies collaterals to the PDA and distal right coronary territory  which is totally occluded proximally.  No significant obstructive        lesions are noted in the major obtuse marginal branches.  The fifth        obtuse marginal, which is small, contains diffuse proximal vessel        disease.     d. Right coronary artery:  Totally occluded proximally.   A. Bypass graft angiography:     a. Saphenous vein graft to the diagonal:  This graft is smooth, large and        widely patent.  Proximal to the graft insertion site of the first        diagonal is an eccentric 60-70% narrowing.  There is retrograde        filling of the LAD from the saphenous vein graft to the first        diagonal.  There is moderate diffuse disease of the mid and distal        LAD.  There is also retrograde filling of the LIMA with competitive        flow in the LIMA noted.  The first __________ has been noted.     b. Saphenous vein graft to the right coronary:  Totally occluded.     c. LIMA to the LAD:  The LIMA graft to the LAD contained proximal        irregularities with up to 40-50% narrowing.  The graft is patent and        keeps the mid LAD with competitive flow, fighting against the diagonal        that is filling the LAD retrograde via saphenous vein graft.   CONCLUSIONS:  1. Severe native vessel coronary disease with  total occlusion of proximal     left anterior descending, total occlusion of proximal right coronary     artery.  There is mild to moderate circumflex disease with no significant     obstruction noted in the circumflex branches.  2. 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  There is diffuse disease of the left internal     mammary artery to the left anterior descending, but this graft is patent     but somewhat atretic.  The saphenous vein graft to the diagonal, which     also __________ the left anterior descending by retrograde effusion is     widely patent.  3. Left ventricular function overall was normal, ejection fraction was     estimated to be 60%.   PLAN:  Continue medical therapy.                                               Lesleigh Noe, M.D.    HWS/MEDQ  D:  02/29/2004  T:  02/29/2004  Job:  [id number]   cc:   Georgann Housekeeper, M.D.  [address]  [place]  [place] [address]  Fax: [phone]

## 2011-02-16 NOTE — H&P (Signed)
NAME:  Glen Miller NO.:  [id number]   MEDICAL RECORD NO.:  [id number]                   PATIENT TYPE:  EMS   LOCATION:  MAJO                                 FACILITY:  [facility]   PHYSICIAN:  Glen Miller, M.D.                DATE OF BIRTH:  02-Jun-1928   DATE OF ADMISSION:  02/17/2003  DATE OF DISCHARGE:                                HISTORY & PHYSICAL   CHIEF COMPLAINT:  Chest pain x 30 minutes today, relieved by nitroglycerin.   HISTORY OF PRESENT ILLNESS:  This 77 year old married white male is a  patient of Dr. Katrinka Miller with known severe CAD and chronic stable angina.  About  two times this week at cardiac rehabilitation, the patient experienced  heart skipping.  There was no chest pain.  No associated lightheadedness  or dizziness.  Today he had the skipping beats at cardiac rehabilitation but  no chest pain.  About one-half hour after arriving home, he developed heavy  pressure across the chest and up to the neck.  No associated symptoms.  He  took a total of 4 sublingual nitroglycerin which eventually gave him  complete relief.  Duration of discomfort was approximately one-half hour.  The patient was brought by wife to the ER.  By the time he arrived, he was  feeling all right and actually almost turned around and went home.  Currently he is pain free.  The emergency room physician started IV  nitroglycerin and IV heparin.   ALLERGIES:  No known drug allergies.   MEDICATIONS:  1. Lantus 40 units q.h.s.  2. Prozac 20 mg.  3. Hyzaar 100/25 at 1/2 tablet a day.  4. Norvasc 10 mg.  5. Isosorbide ER 60 mg.  6. Avandia 8 mg.  7. Glucophage 500 mg 2 tablets b.i.d.  8. Neurontin 400 mg 1 b.i.d.  9. Zocor 80 mg daily.  10.      Zyrtec 10 mg.  11.      Prilosec 20 mg.  12.      Flomax 0.4 mg.  13.      Aspirin 325 a day.   PAST MEDICAL HISTORY:  1. CAD with CABG in 1994.  Has had RCA pecutaneous coronary intervention and     restenosis  since.  SEMI 05/1999.  Cardiac catheterization at that time     revealed occluded SVG to RCA, occluded LIMA to LAD, native RCA 100%     occlusion, circumflex 100% occlusion.  They recanalized the SVT to RCA.  2. Cardiolite 03/2002 revealed no ischemia, small inferior scar, ejection     fraction 48%.  3. Hypertension.  4. Diabetes mellitus, type 2, on insulin.  5. Hyperlipidemia.  6. Neuropathy from diabetes.  7. DJD.  8. Mild obstructive sleep apnea.  9. Nephrolithiasis.   FAMILY HISTORY:  There is a history of coronary disease.   SOCIAL HISTORY:  The patient is married, father of two, grandfather of  three, great grandfather of six.  History of tobacco use, quit in 1994.  Prior to that, two to three-pack-a -day smoker for about 50 years.  No  alcohol.  He does do cardiac rehabilitation currently.   REVIEW OF SYSTEMS:  No constitutional symptoms, reflux, peptic ulcer  disease, melena, or hematuria.  No tarry stools.  No diarrhea or  constipation.  No awareness of palpitations. Occasional mild lightheadedness  but no presyncope or syncope.   PHYSICAL EXAMINATION:  VITAL SIGNS:  Blood pressure 164/91, pulse 69,  respirations 20.  GENERAL:  Alert and oriented x 3 in no acute distress.  HEENT:  Normocephalic and atraumatic.  NECK: Supple without JVD.  Questionable faint right carotid bruit.  LUNGS:  Clear to auscultation.  COR:  Irregular rhythm.  No murmur.  ABDOMEN:  Soft, obese, nontender.  Positive bowel sounds.  No  hepatosplenomegaly.  EXTREMITIES:  2+ femoral pulses without bruit, 2+ distal pulses with 1+  pitting edema, pretibial.  NEUROLOGIC:  Nonfocal and intact.   LABORATORY DATA:  EKG shows normal sinus rhythm with nonspecific ST-T wave  changes.  No acute abnormalities.  (No change from EKG 03/2002.)   Telemetry:  He is having trigeminy, asymptomatic.   WBC 7.4, hemoglobin 12.9, platelets 248.  CK 107, MB 1.3, troponin I 0.01.  INR 1.1  Potassium 3.5, BUN 13,  creatinine 1.1.   Chest x-ray results are pending.   IMPRESSION:  1. Chest pain, rule out myocardial infarction.  2. Trigeminy, bigeminy, symptoms.  3. Coronary artery disease with graft failure.  4. Hypertension.  5. Diabetes mellitus, type 2, requiring insulin.  6. Borderline low potassium.   PLAN:  Will discuss with Dr. Katrinka Miller re disposition.  Currently being treated  with IV heparin, IV nitroglycerin per the emergency room physician.  Cardiac  enzymes have been ordered and are being cycled.  Blood pressure is elevated,  but patient cannot remember if took blood pressure medications yet today.  Will replete potassium.  Consider adding beta block therapy to suppress PVCs  and help with blood pressure control.     Glen Miller, P.A.                   Glen Miller, M.D.    TCJ/MEDQ  D:  02/17/2003  T:  02/17/2003  Job:  [id number]

## 2011-02-16 NOTE — Discharge Summary (Signed)
[place]  Patient:    Glen Miller                        MRN: [id number] Adm. Date:  [id number] Disc. Date: [id number] Attending:  Lyn Records. Iii Dictator:   Anselm Lis, N.P. CC:         Lum Babe, M.D.                           Discharge Summary  PROCEDURES: 1.  Adenosine Cardiolite, which was negative for evidence of myocardial ischemia;     question inferior wall thinning versus scar.  EF 49%.  DISCHARGE DIAGNOSIS/HOSPITAL SUMMARY: #1 - CHEST PAIN:  [name] is a pleasant 77 year old with a history of CAD, diabetes mellitus, hypercholesterolemia, and hypertension, admitted with recurrent episodes of chest pain.  He took two sublingual nitroglycerin with relief. Initially evaluated by Dr. Kristeen Miss, who felt his EKG was non-acute.  The patient did rule out for myocardial infarction by negative serial cardiac enzymes. On October 11, 1999 he underwent an adenosine Cardiolite which was negative for  evidence of myocardial ischemia; question inferior wall thinning versus scar. Calculated left ventricular ejection fraction 49%.  He had initially been placed on IV nitrates and IV heparin, which was discontinued on October 11, 1999.  #2 - HYPERTENSION:  Fair control during this hospitalization on medical regimen.  #3 - DIABETES MELLITUS:  Blood sugars ranging from 126 to 131, which was acceptable range in view of intermittently holding his oral antihyperglycemics this admission in preparation for tests.  DISCHARGE CONDITION:  Stable.  DISCHARGE MEDICATIONS: 1.  Ketoprofen 200 mg p.o. q.d. 2.  Glucophage 1 gram p.o. b.i.d. 3.  Glucotrol XL 10 mg p.o. b.i.d. 4.  Actos 30 mg p.o. q.d. 5.  Norvasc 10 mg q.d. 6.  Hyzaar 50/12.5 mg p.o. q.d. 7.  Zocor 80 mg p.o. q.d. 8.  Enteric-coated aspirin 325 mg q.d. 9.  Neurontin 400 mg p.o. q.d. 10. Flonase 0.4 mg p.o. q.d. 11. Prozac 20 mg p.o. q.d. 12. Nitroglycerin 0.4 mg  tablets sublingual p.r.n. chest pain.  DIET:  Low-fat/low-cholesterol/no-added salt.  SPECIAL INSTRUCTIONS: 1.  Call if problems. 2.  Follow-up with Dr. Verdis Prime on Thursday, November 02, 1999, at 3:15 p.m.  LABORATORY DATA:  Urinalysis was negative.  Serial cardiac enzymes were negative x three.  First Troponin I was 0.03.  Chemistry profile revealed sodium of 143, potassium 3.9, chloride 107, CO2 23, glucose ranging 126 to 131, BUN 31, creatinine 1.2.  Calcium 8.5.  Admission pro time 14.5 with INR 1.3, and PTT 24.  CBC revealed WBC 7.5, hemoglobin 13; hemoglobin 13.8 at the time of discharge.  Platelets ranging 218,000 to 252,000.  Chest x-ray revealed moderate cardiomegaly with chronic interstitial prominence.  PAST MEDICAL HISTORY: 1.  Coronary atherosclerotic heart disease.     a.  (1994) Coronary artery bypass graft.     b.  (August 2000) Myocardial infarction.  Cardiac catheterization showed         occlusion of graft to RCA with recanalization; inferior hypokinesis with         EF of 50%; native RCA 100%; native LAD 100% proximal.  LIMA to LAD was         occluded.     c.  Prior procedures include stent of RCA (May 1998), September 1998  restenosis, December 1998 PTCA of restenosis. 2.  Hypertension. 3.  Diabetes mellitus. 4.  Hyperlipidemia. 5.  Renal stone. 6.  Polyneuropathy. 7.  DJD. 8.  Left ureteral stone. 9.  Right knee arthroplasty x three. 10. Left knee arthroplasty. 11. L4 disk. DD:  11/06/99 TD:  11/06/99 Job: [id number]

## 2011-02-16 NOTE — Op Note (Signed)
NAME:  Glen Miller, Glen Miller                ACCOUNT NO.:  [id number]   MEDICAL RECORD NO.:  [id number]          PATIENT TYPE:  AMB   LOCATION:  DSC                          FACILITY:  [facility]   PHYSICIAN:  Artist Pais. Weingold, M.D.DATE OF BIRTH:  [birth year]   DATE OF PROCEDURE:  DATE OF DISCHARGE:                               OPERATIVE REPORT   PREOPERATIVE DIAGNOSIS:  Right thumb chronic ulnar-sided paronychia.   POSTOPERATIVE DIAGNOSIS:  Right thumb chronic ulnar-sided paronychia.   PROCEDURE:  I&D above with culturing of eponychial fold and removal of  nail plate.   SURGEON:  Artist Pais. Mina Marble, M.D.   ASSISTANT:  None.   ANESTHESIA:  Regional.   TOURNIQUET TIME:  25 minutes.   COMPLICATIONS:  No complications.   DRAINS:  No drains.   OPERATIVE REPORT:  The patient was taken to the operating suite.  After  the induction of adequate regional anesthetic, the right upper extremity  is prepped in the usual sterile fashion.  Once this was done, the  eponychial fold on the ulnar side of the right thumb was debrided down  to normal tissues.  This tissue was sent for aerobic, anaerobic and  fungal cultures.  The nail plate was carefully elevated off of the  underlying nail bed on the ulnar third of the right thumb and removed in  its entirety.  The undersurface of the eponychial fold in this area was  carefully debrided using a curet and rongeur.  All devitalized tissue  was removed.  It was thoroughly irrigated and a piece of 1 x 8 Xeroform  was then placed on the eponychial fold and wrapped around the tip of the  thumb.  The patient was then dressed with 4 x 4s and Coban wrap.  The  patient tolerated the procedure well.      Artist Pais Mina Marble, M.D.  Electronically Signed     MAW/MEDQ  D:  09/11/2006  T:  09/12/2006  Job:  [id number]

## 2011-02-16 NOTE — Discharge Summary (Signed)
NAME:  Glen Miller, Glen Miller                ACCOUNT NO.:  [id number]   MEDICAL RECORD NO.:  [id number]          PATIENT TYPE:  INP   LOCATION:  1616                         FACILITY:  [facility]   PHYSICIAN:  Timothy E. Earlene Plater, M.D. DATE OF BIRTH:  01-16-1928   DATE OF ADMISSION:  10/22/2005  DATE OF DISCHARGE:  10/23/2005                                 DISCHARGE SUMMARY   FINAL DIAGNOSES:  Chronic cholecystolithiasis.   Please see the enclosed notes. This 77 year old Caucasian lady male who is  known to have gallstones with previously elevated liver function studies  presents with recurrent symptoms of cholecystolithiasis. In spite of his  coronary artery disease, COPD, insulin dependent diabetes, BPH, and obesity,  he has been seen and evaluated by Dr. Verdis Prime and [place] and think that  the risks of not doing the surgery are greater than the risk of doing the  surgery and we all agree including the patient. He was brought to the  hospital urgently. His laboratory data showed normal liver function studies,  slight anemic and hematocrit was 35, potassium 3.4. He was taken to the  operating room where a laparoscopic cholecystectomy and operative  cholangiogram were accomplished. He did very well in the immediate 24-hour  postoperative recovery without complications. He was seen during that time  by internal medicine and Dr. Patsi Sears and they agreed with his care. On  postoperative day 1, he was up and ambulatory, tolerating the diet, good  vital signs, having some difficulty voiding after the Foley catheter had  been removed. Because he was doing well and he finally voided, he was  discharged home. He was given Vicodin for pain to continue in addition to  his other previous medications. He will be followed appropriately as an  outpatient.      Timothy E. Earlene Plater, M.D.  Electronically Signed     TED/MEDQ  D:  12/04/2005  T:  12/05/2005  Job:  [id number]   cc:   Georgann Housekeeper, MD  Fax:  [phone]   Sigmund I. Patsi Sears, M.D.  Fax: [phone]   Lyn Records, M.D.  Fax: [phone]

## 2011-02-16 NOTE — H&P (Signed)
[place]  Patient:    Glen Miller                        MRN: [id number] Adm. Date:  [id number] Attending:  Lyn Records. Iii CC:         Lum Babe, M.D.             Darci Needle, M.D.                         History and Physical  HISTORY:  Mr. Widrig is a 77 year old gentleman with a history of coronary artery disease, diabetes mellitus, hypercholesterolemia, hypertension, who is admitted  with recent episodes of chest pain.  The patient has an extensive history of coronary artery disease.  He has had multiple catheterizations and angioplasties.  He had coronary artery bypass grafting in 1994.  He is status post a subendocardial myocardial infarction in August of 2000.  He has done fairly well until tonight when he developed severe  substernal chest pain.  The pain was finally relieved with three sublingual nitroglycerins.  The pain felt fairly similar to his previous episodes of pain ith a subendocardial myocardial infarction.  He is admitted for further evaluation.  The patient also has a history of hiatal hernia but he states that this pain was not similar to his hiatal hernia pain.  CURRENT MEDICATIONS:  1. Glucophage 1 g p.o. b.i.d.  2. Glucotrol XL 10 mg p.o. b.i.d.  3. Actos 30 mg a day.  4. Imdur 60 mg a day.  5. Norvasc 10 mg a day.  6. Hyzaar 50/12.5 mg p.o. q.d.  7. Zocor 80 mg q.h.s.  8. Enteric-coated aspirin 325 mg a day.  9. Neurontin 400 mg p.o. q.d. 10. Flonase 0.4 mg p.o. q.d. 11. Prozac 20 mg a day. 12. Ketoprofen 200 mg a day.  ALLERGIES:  He has no known drug allergies.  PAST MEDICAL HISTORY:  1. Coronary artery disease.  2. Hypertension.  3. Diabetes mellitus with peripheral neuropathy.  4. Hypercholesterolemia.  SOCIAL HISTORY:  The patient has a remote history of cigarette smoking.  FAMILY HISTORY:  His family history is positive for coronary artery disease.  REVIEW OF SYSTEMS:  His  review of systems was reviewed and is essentially negative. He denies any PND, orthopnea, syncope or presyncope.  He denies any severe episodes of chest pain before tonight, since his last heart catheterization in August.  PHYSICAL EXAMINATION:  GENERAL:  On physical exam, he is a middle-aged white male in no acute distress.  VITAL SIGNS:  His temperature was 101.2, his blood pressure was 132/77 with a heart rate of 80.  HEENT:  Exam reveals 2+ carotids but no bruits.  There is no JVD and no thyromegaly.  LUNGS:  Clear to auscultation.  HEART:  Regular rate.  S1 and S2, with no murmurs, gallops or rubs.  His PMI is  non-displaced.  ABDOMEN:  Good bowel sounds and is nontender.  He has no hepatosplenomegaly and no masses or bruits.  EXTREMITIES:  He has 2+ pulses.  He has no clubbing, cyanosis, or edema.  His calves are nontender.  NEUROLOGIC:  Nonfocal.  His gait was not assessed.  GU:  Deferred.  LABORATORY AND X-RAY FINDINGS:  EKG reveals normal sinus rhythm with nonspecific ST and T wave abnormalities.  His laboratory data is pending.  His chest x-ray was an AP  film.  He has mild cardiomegaly.  His lungs are clear.  ASSESSMENT:  The patient presents with symptoms consistent with unstable angina. He took three sublingual nitroglycerin for relief.  His pain was similar to his  subendocardial pain in August.  His electrocardiogram is non-acute.  We will admit him and collect serial CPKs.  Will put him on heparin and nitroglycerin drip. Dr. Darci Needle and Dr. Lum Babe will see him in the morning to make a disposition.  He has a temperature of 101.  We will recheck his temperature and will check a BC with differential tonight as well as in the morning.  We will also check a urinalysis. DD:  10/09/99 TD:  10/10/99 Job: [id number]

## 2011-11-07 ENCOUNTER — Other Ambulatory Visit (HOSPITAL_COMMUNITY): Payer: Self-pay | Admitting: Internal Medicine

## 2011-11-07 DIAGNOSIS — J449 Chronic obstructive pulmonary disease, unspecified: Secondary | ICD-10-CM

## 2011-11-12 ENCOUNTER — Ambulatory Visit (HOSPITAL_COMMUNITY)
Admission: RE | Admit: 2011-11-12 | Discharge: 2011-11-12 | Disposition: A | Discharge status: Home / Self Care | Payer: Medicare Other | Source: Ambulatory Visit | Attending: Internal Medicine | Admitting: Internal Medicine

## 2011-11-12 DIAGNOSIS — J449 Chronic obstructive pulmonary disease, unspecified: Secondary | ICD-10-CM | POA: Insufficient documentation

## 2011-11-12 DIAGNOSIS — J4489 Other specified chronic obstructive pulmonary disease: Secondary | ICD-10-CM | POA: Insufficient documentation

## 2011-11-12 MED ORDER — ALBUTEROL SULFATE (5 MG/ML) 0.5% IN NEBU
2.5000 mg | INHALATION_SOLUTION | Freq: Once | RESPIRATORY_TRACT | Status: AC
  Administered 2011-11-12: 2.5 mg via RESPIRATORY_TRACT

## 2011-12-03 ENCOUNTER — Emergency Department (HOSPITAL_COMMUNITY): Payer: Medicare Other

## 2011-12-03 ENCOUNTER — Encounter (HOSPITAL_COMMUNITY): Payer: Self-pay

## 2011-12-03 ENCOUNTER — Other Ambulatory Visit: Payer: Self-pay

## 2011-12-03 ENCOUNTER — Emergency Department (HOSPITAL_COMMUNITY)
Admission: EM | Admit: 2011-12-03 | Discharge: 2011-12-03 | Disposition: A | Discharge status: Home / Self Care | Payer: Medicare Other | Attending: Emergency Medicine | Admitting: Emergency Medicine

## 2011-12-03 DIAGNOSIS — W19XXXA Unspecified fall, initial encounter: Secondary | ICD-10-CM

## 2011-12-03 DIAGNOSIS — G319 Degenerative disease of nervous system, unspecified: Secondary | ICD-10-CM | POA: Insufficient documentation

## 2011-12-03 DIAGNOSIS — Y93E1 Activity, personal bathing and showering: Secondary | ICD-10-CM | POA: Insufficient documentation

## 2011-12-03 DIAGNOSIS — J449 Chronic obstructive pulmonary disease, unspecified: Secondary | ICD-10-CM | POA: Insufficient documentation

## 2011-12-03 DIAGNOSIS — I517 Cardiomegaly: Secondary | ICD-10-CM | POA: Insufficient documentation

## 2011-12-03 DIAGNOSIS — F0390 Unspecified dementia without behavioral disturbance: Secondary | ICD-10-CM | POA: Insufficient documentation

## 2011-12-03 DIAGNOSIS — I25810 Atherosclerosis of coronary artery bypass graft(s) without angina pectoris: Secondary | ICD-10-CM | POA: Diagnosis present

## 2011-12-03 DIAGNOSIS — I10 Essential (primary) hypertension: Secondary | ICD-10-CM | POA: Insufficient documentation

## 2011-12-03 DIAGNOSIS — R269 Unspecified abnormalities of gait and mobility: Secondary | ICD-10-CM | POA: Insufficient documentation

## 2011-12-03 DIAGNOSIS — W07XXXA Fall from chair, initial encounter: Secondary | ICD-10-CM | POA: Insufficient documentation

## 2011-12-03 DIAGNOSIS — Y92009 Unspecified place in unspecified non-institutional (private) residence as the place of occurrence of the external cause: Secondary | ICD-10-CM | POA: Insufficient documentation

## 2011-12-03 DIAGNOSIS — I712 Thoracic aortic aneurysm, without rupture, unspecified: Secondary | ICD-10-CM | POA: Insufficient documentation

## 2011-12-03 DIAGNOSIS — J4489 Other specified chronic obstructive pulmonary disease: Secondary | ICD-10-CM | POA: Insufficient documentation

## 2011-12-03 DIAGNOSIS — R079 Chest pain, unspecified: Secondary | ICD-10-CM | POA: Insufficient documentation

## 2011-12-03 DIAGNOSIS — E119 Type 2 diabetes mellitus without complications: Secondary | ICD-10-CM | POA: Insufficient documentation

## 2011-12-03 HISTORY — DX: Chronic obstructive pulmonary disease, unspecified: J44.9

## 2011-12-03 HISTORY — DX: Unspecified dementia, unspecified severity, without behavioral disturbance, psychotic disturbance, mood disturbance, and anxiety: F03.90

## 2011-12-03 HISTORY — DX: Essential (primary) hypertension: I10

## 2011-12-03 LAB — DIFFERENTIAL
Basophils Absolute: 0 10*3/uL (ref 0.0–0.1)
Eosinophils Absolute: 0.2 10*3/uL (ref 0.0–0.7)
Eosinophils Relative: 3 % (ref 0–5)
Monocytes Absolute: 0.5 10*3/uL (ref 0.1–1.0)

## 2011-12-03 LAB — URINALYSIS, ROUTINE W REFLEX MICROSCOPIC
Bilirubin Urine: NEGATIVE
Ketones, ur: NEGATIVE mg/dL
Nitrite: NEGATIVE
Protein, ur: NEGATIVE mg/dL
pH: 7 (ref 5.0–8.0)

## 2011-12-03 LAB — CBC
Hemoglobin: 10.5 g/dL — ABNORMAL LOW (ref 13.0–17.0)
Platelets: 171 10*3/uL (ref 150–400)
RBC: 4.57 MIL/uL (ref 4.22–5.81)
WBC: 8.1 10*3/uL (ref 4.0–10.5)

## 2011-12-03 LAB — BASIC METABOLIC PANEL
BUN: 14 mg/dL (ref 6–23)
CO2: 36 mEq/L — ABNORMAL HIGH (ref 19–32)
Calcium: 9.2 mg/dL (ref 8.4–10.5)
Chloride: 100 mEq/L (ref 96–112)
Creatinine, Ser: 0.91 mg/dL (ref 0.50–1.35)
GFR calc Af Amer: 90 mL/min — ABNORMAL LOW (ref >90)

## 2011-12-03 MED ORDER — ACETAMINOPHEN 325 MG PO TABS
975.0000 mg | ORAL_TABLET | Freq: Once | ORAL | Status: AC
  Administered 2011-12-03: 975 mg via ORAL
  Filled 2011-12-03: qty 3

## 2011-12-03 MED ORDER — TETANUS-DIPHTH-ACELL PERTUSSIS 5-2.5-18.5 LF-MCG/0.5 IM SUSP
0.5000 mL | Freq: Once | INTRAMUSCULAR | Status: AC
  Administered 2011-12-03: 0.5 mL via INTRAMUSCULAR
  Filled 2011-12-03: qty 0.5

## 2011-12-03 MED ORDER — HYDROCODONE-ACETAMINOPHEN 5-325 MG PO TABS
1.0000 | ORAL_TABLET | Freq: Once | ORAL | Status: AC
  Administered 2011-12-03: 1 via ORAL

## 2011-12-03 MED ORDER — HYDROCODONE-ACETAMINOPHEN 5-325 MG PO TABS
1.0000 | ORAL_TABLET | Freq: Once | ORAL | Status: DC
  Filled 2011-12-03: qty 1

## 2011-12-03 MED ORDER — HYDROCODONE-ACETAMINOPHEN 5-500 MG PO TABS: 1.0000 | ORAL_TABLET | Freq: Four times a day (QID) | ORAL | Status: AC | PRN

## 2011-12-03 NOTE — ED Provider Notes (Addendum)
History     CSN: [id number]  Arrival date & time 12/03/11  0844   None     Chief Complaint  Patient presents with  . Fall    (Consider location/radiation/quality/duration/timing/severity/associated sxs/prior treatment) HPI Level V caveat dementia, the patient fell while seated in the shower approximately 7 AM today complains of bilateral rib pain since the event. No other complaint wife reports he struck his head he was unable to get up after the fall. No treatment prior to coming here pain at ribs is worse with palpation or movement brought by EMS Past Medical History  Diagnosis Date  . Hypertension   . Diabetes mellitus   . COPD (chronic obstructive pulmonary disease)   . Dementia     No past surgical history on file.  No family history on file.  History  Substance Use Topics  . Smoking status: Not on file  . Smokeless tobacco: Not on file  . Alcohol Use:      no alcohol  Review of Systems  Unable to perform ROS: Dementia    Allergies  Review of patient's allergies indicates no known allergies.  Home Medications  No current outpatient prescriptions on file.  BP 153/84  Pulse 58  Temp(Src) 97.8 F (36.6 C) (Oral)  Resp 15  SpO2 95%  Physical Exam  Nursing note and vitals reviewed. Constitutional: He appears well-developed and well-nourished. No distress.  HENT:  Head: Normocephalic and atraumatic.  Eyes: Conjunctivae are normal. Pupils are equal, round, and reactive to light.  Neck: Neck supple. No tracheal deviation present. No thyromegaly present.  Cardiovascular: Normal rate and regular rhythm.   No murmur heard. Pulmonary/Chest: Effort normal and breath sounds normal.       Tender at lateral ribs bilaterally no crepitance or flail  Abdominal: Soft. Bowel sounds are normal. He exhibits no distension. There is no tenderness.  Musculoskeletal: Normal range of motion. He exhibits no edema and no tenderness.       Entire spine nontender, pelvis  stable nontender, all 4 extremities without tenderness neurovascularly intact  Neurological: He is alert. No cranial nerve deficit. Coordination normal.       Follow simple commands moves all extremities  Skin: Skin is warm and dry. No rash noted.       3 cm linear abrasion at distal right forearm, linear abrasion entire length of back parallel to long axis  Psychiatric: He has a normal mood and affect.    ED Course  Procedures (including critical care time)  Date: 12/03/2011  Rate: 60  Rhythm: sinus bradycardia  QRS Axis: normal  Intervals: normal  ST/T Wave abnormalities: nonspecific T wave changes  Conduction Disutrbances:nonspecific intraventricular conduction delay  Narrative Interpretation:   Old EKG Reviewed: changes noted Tracing from 03/14/2009 showed sinus tachycardia approximately 130 beats per minute otherwise no significant change Labs Reviewed - No data to display  No results found. Spoke with Dr. Eula Listen and notified him of aortic aneurysm seen on cervical spine CT scan. He suggests patient has poor operative candidate and not to call vascular surgery at present. He will obtain chest CT scan as outpatient in 3 months  No diagnosis found.  I had lengthy discussion with patient's family, multiple family members, and patient's pastor who refuse to take patient home; they request hospitalization for observation and pain control At 12:50 PM patient declines further pain medicine states pain is improved after treatment with Vicodin MDM  Dr.Joseph consulted, for possible 24-hour observation Diagnosis #1 fall #2  chronic anemia #3 thoracic aortic aneurysm #4 multiple contusions       Doug Sou, MD 12/03/11 1302 Addendum Dr. Jomarie Longs evaluate patient in the emergency department spoke with the family and arrange for outpatient follow up  Doug Sou, MD 12/03/11 1415

## 2011-12-03 NOTE — ED Notes (Signed)
Pt ambulated with walker and assisted by Tech, pt tolerated well.

## 2011-12-03 NOTE — ED Notes (Signed)
Pt fell in shower off of shower chair this am, denieS LOC

## 2011-12-03 NOTE — ED Notes (Signed)
Hospitalist at bedside 

## 2013-07-31 ENCOUNTER — Ambulatory Visit (INDEPENDENT_AMBULATORY_CARE_PROVIDER_SITE_OTHER): Payer: Medicare Other

## 2013-07-31 VITALS — BP 99/58 | HR 80 | Resp 20 | Ht 70.0 in | Wt 230.0 lb

## 2013-07-31 DIAGNOSIS — E1140 Type 2 diabetes mellitus with diabetic neuropathy, unspecified: Secondary | ICD-10-CM

## 2013-07-31 DIAGNOSIS — E1149 Type 2 diabetes mellitus with other diabetic neurological complication: Secondary | ICD-10-CM

## 2013-07-31 DIAGNOSIS — R609 Edema, unspecified: Secondary | ICD-10-CM

## 2013-07-31 DIAGNOSIS — J449 Chronic obstructive pulmonary disease, unspecified: Secondary | ICD-10-CM

## 2013-07-31 DIAGNOSIS — I872 Venous insufficiency (chronic) (peripheral): Secondary | ICD-10-CM

## 2013-07-31 NOTE — Progress Notes (Signed)
  Subjective:    Patient ID: Glen Miller, male    DOB: 1927/10/18, 79 y.o.   MRN: [id number]  HPI Comments: "His foot has been really swollen. Doesn't really hurt, but I wanted it checked"  Right foot  N - none L - right foot D - 1 day O - sudden C - swelling, no injury, pt daughter states it does look some better today A - none T - none      Review of Systems  Respiratory: Positive for shortness of breath.        Pt on oxygen  Musculoskeletal: Positive for back pain.       Knee pain - right  All other systems reviewed and are negative.       Objective:   Physical Exam  Constitutional: He appears well-developed and well-nourished.  Cardiovascular:  Pulses:      Dorsalis pedis pulses are 2+ on the right side, and 2+ on the left side.       Posterior tibial pulses are 2+ on the right side, and 2+ on the left side.  Capillary refill time 3 seconds all digits bilateral. Skin temperature warm to cool turgor diminished there is significant + to +2 edema bilateral left more so than right. Mild to moderate varicosities noted  Musculoskeletal:  Patient is minimally ambulatory has decreased his walking activities is moleskin a wheelchair now with is decreased and walking his daughter indicates he's still more swelling in his legs left more so than right orthopedic biomechanical exam the foot reveals rectus foot mild semirigid digital contractures 2 through 5  Neurological:  Epicritic and proprioceptive sensations seem to be intact although diminished on Semmes Weinstein testing to the forefoot and digits there is normal plantar response DTRs not elicited  Skin: Skin is warm and dry. No cyanosis. Nails show no clubbing.  Skin color pigment normal hair growth absent nails somewhat criptotic otherwise unremarkable no signs of infection no open wounds or ulcerations are noted.          Assessment & Plan:  Assessment patient does have history of diabetes some mild peripheral  neuropathy patient also has a history of COPD CHF and more recent decreased activities levels. Has significant edema plus one on the right +2 on the left. At this time patient's edema is likely to lead to decreased activities and venous insufficiency. Recommendation at this time would be compression stockings prescription for support hose is dispensed 20-30 mm Hg compression is recommended knee-high TED stockings. Patient will followup on an as-needed basis in the future. Palliative care and as-needed basis if he has any further problems also recommended elevating feet and legs whenever sedentary or a wheelchair or in bed.  Alvan Dame DPM

## 2013-07-31 NOTE — Patient Instructions (Signed)
Edema  Edema is an abnormal build-up of fluids in tissues. Because this is partly dependent on gravity (water flows to the lowest place), it is more common in the legs and thighs (lower extremities). It is also common in the looser tissues, like around the eyes. Painless swelling of the feet and ankles is common and increases as a person ages. It may affect both legs and may include the calves or even thighs. When squeezed, the fluid may move out of the affected area and may leave a dent for a few moments.  CAUSES    Prolonged standing or sitting in one place for extended periods of time. Movement helps pump tissue fluid into the veins, and absence of movement prevents this, resulting in edema.   Varicose veins. The valves in the veins do not work as well as they should. This causes fluid to leak into the tissues.   Fluid and salt overload.   Injury, burn, or surgery to the leg, ankle, or foot, may damage veins and allow fluid to leak out.   Sunburn damages vessels. Leaky vessels allow fluid to go out into the sunburned tissues.   Allergies (from insect bites or stings, medications or chemicals) cause swelling by allowing vessels to become leaky.   Protein in the blood helps keep fluid in your vessels. Low protein, as in malnutrition, allows fluid to leak out.   Hormonal changes, including pregnancy and menstruation, cause fluid retention. This fluid may leak out of vessels and cause edema.   Medications that cause fluid retention. Examples are sex hormones, blood pressure medications, steroid treatment, or anti-depressants.   Some illnesses cause edema, especially heart failure, kidney disease, or liver disease.   Surgery that cuts veins or lymph nodes, such as surgery done for the heart or for breast cancer, may result in edema.  DIAGNOSIS   Your caregiver is usually easily able to determine what is causing your swelling (edema) by simply asking what is wrong (getting a history) and examining you (doing  a physical). Sometimes x-rays, EKG (electrocardiogram or heart tracing), and blood work may be done to evaluate for underlying medical illness.  TREATMENT   General treatment includes:   Leg elevation (or elevation of the affected body part).   Restriction of fluid intake.   Prevention of fluid overload.   Compression of the affected body part. Compression with elastic bandages or support stockings squeezes the tissues, preventing fluid from entering and forcing it back into the blood vessels.   Diuretics (also called water pills or fluid pills) pull fluid out of your body in the form of increased urination. These are effective in reducing the swelling, but can have side effects and must be used only under your caregiver's supervision. Diuretics are appropriate only for some types of edema.  The specific treatment can be directed at any underlying causes discovered. Heart, liver, or kidney disease should be treated appropriately.  HOME CARE INSTRUCTIONS    Elevate the legs (or affected body part) above the level of the heart, while lying down.   Avoid sitting or standing still for prolonged periods of time.   Avoid putting anything directly under the knees when lying down, and do not wear constricting clothing or garters on the upper legs.   Exercising the legs causes the fluid to work back into the veins and lymphatic channels. This may help the swelling go down.   The pressure applied by elastic bandages or support stockings can help reduce ankle swelling.     A low-salt diet may help reduce fluid retention and decrease the ankle swelling.   Take any medications exactly as prescribed.  SEEK MEDICAL CARE IF:   Your edema is not responding to recommended treatments.  SEEK IMMEDIATE MEDICAL CARE IF:    You develop shortness of breath or chest pain.   You cannot breathe when you lay down; or if, while lying down, you have to get up and go to the window to get your breath.   You are having increasing  swelling without relief from treatment.   You develop a fever over 102 F (38.9 C).   You develop pain or redness in the areas that are swollen.   Tell your caregiver right away if you have gained 3 lb/1.4 kg in 1 day or 5 lb/2.3 kg in a week.  MAKE SURE YOU:    Understand these instructions.   Will watch your condition.   Will get help right away if you are not doing well or get worse.  Document Released: 09/17/2005 Document Revised: 03/18/2012 Document Reviewed: 05/05/2008  [employer] Patient Information 2014 [employer].

## 2013-08-05 ENCOUNTER — Telehealth: Payer: Self-pay | Admitting: *Deleted

## 2013-08-05 NOTE — Telephone Encounter (Signed)
Pt's  dtr, Glen Miller states is having difficulty putting on his compression hose.  Glen Miller states she has had thumb surgery and has only gotten the compression hose on twice.  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  Glen Miller states she will come in this week.

## 2013-08-11 ENCOUNTER — Ambulatory Visit (INDEPENDENT_AMBULATORY_CARE_PROVIDER_SITE_OTHER): Payer: Medicare Other | Admitting: Podiatry

## 2013-08-11 ENCOUNTER — Encounter: Payer: Self-pay | Admitting: Podiatry

## 2013-08-11 VITALS — BP 159/88 | HR 74 | Resp 12

## 2013-08-11 DIAGNOSIS — B351 Tinea unguium: Secondary | ICD-10-CM

## 2013-08-11 DIAGNOSIS — M79609 Pain in unspecified limb: Secondary | ICD-10-CM

## 2013-08-11 NOTE — Progress Notes (Signed)
Sam presents today with a chief complaint of thick yellow dystrophic painful nails.  Objective: Nails are thick yellow dystrophic clinically mycotic.  Assessment: Pain in limb secondary to onychomycosis bilateral.  Plan: Debridement of nails 1 through 5 bilateral covered service secondary to pain.

## 2013-08-12 ENCOUNTER — Telehealth: Payer: Self-pay | Admitting: *Deleted

## 2013-08-12 NOTE — Telephone Encounter (Addendum)
Glen Miller, pt's dtr states still unable to get pt's 20-75mmHg compression hose on.  Can DR Al Corpus prescribe a lower compression stocking weight?  Glen Miller has had recent hand surgery.  Contacted pt's dtrLupita Miller [phone] and faxed orders for Teds 15-33mmHg knee high hose to [facility] per Donna's request and Dr Al Corpus orders.

## 2013-08-13 NOTE — Telephone Encounter (Signed)
Its ok to prescribe a lower compression.  I did not prescribe these but remind her that the lower compression might not help.  I know you can purchase a devise to help with the application of the hose.  She might want to check into that. Please prescribe the hose Val.

## 2013-09-28 ENCOUNTER — Ambulatory Visit (INDEPENDENT_AMBULATORY_CARE_PROVIDER_SITE_OTHER): Payer: Medicare Other

## 2013-09-28 ENCOUNTER — Encounter: Payer: Self-pay | Admitting: Podiatry

## 2013-09-28 ENCOUNTER — Telehealth: Payer: Self-pay | Admitting: *Deleted

## 2013-09-28 ENCOUNTER — Ambulatory Visit: Payer: Medicare Other | Admitting: Podiatry

## 2013-09-28 VITALS — BP 127/68 | HR 63 | Resp 18

## 2013-09-28 DIAGNOSIS — L97509 Non-pressure chronic ulcer of other part of unspecified foot with unspecified severity: Secondary | ICD-10-CM

## 2013-09-28 DIAGNOSIS — S93609A Unspecified sprain of unspecified foot, initial encounter: Secondary | ICD-10-CM

## 2013-09-28 NOTE — Progress Notes (Signed)
   Subjective:    Patient ID: Glen Miller, male    DOB: 29-Jan-1928, 79 y.o.   MRN: [id number]  HPI fell yesterday morning and fell forward and no one was there to witness how he fell states his daughter and sore and tender and bruised and swelled and hurts to move my foot    Review of Systems  Constitutional: Positive for fatigue.  HENT: Negative.   Eyes: Negative.   Respiratory: Positive for cough.        Chest hurts when he cough  Cardiovascular: Negative.   Gastrointestinal: Negative.   Endocrine: Positive for heat intolerance.  Genitourinary: Negative.   Musculoskeletal: Positive for back pain.  Skin: Negative.   Allergic/Immunologic: Negative.   Neurological: Negative.   Hematological: Bruises/bleeds easily.  Psychiatric/Behavioral: Negative.        Objective:   Physical Exam  Patient presents with his daughter. Vascular: The DP and PT pulses are two over four bilaterally  Dermatological: Low-grade edema noted in the dorsum the left foot without any ecchymosis or open skin lesions. The foot appears redness in appearance. The second left toe is moderately edematous. There is no restriction in the motion of the left ankle, subtalar, midtarsal, metatarsal phalangeal joints.  X-ray report left foot nonweightbearing Intact bony structure without fracture or dislocations noted. Posterior and any inferior heel spur noted. Radiographic impression: No acute bony abnormality noted        Assessment & Plan:   Assessment: Sprain left foot  Plan: Patient will continue to ambulate with his existing surgical shoe until swelling and pain reduce.

## 2013-09-28 NOTE — Telephone Encounter (Signed)
Pt's dtr Lupita Leash states pt fell 09/27/2013 and has two places on his feet, that he can't bear to have touched.  Appt given for 09/28/2013 at 145pm with Dr Leeanne Deed.

## 2013-09-28 NOTE — Patient Instructions (Addendum)
Wear a surgical shoe on the left foot daily until swelling and pain ends.

## 2013-09-29 ENCOUNTER — Telehealth: Payer: Self-pay | Admitting: *Deleted

## 2013-09-29 MED ORDER — TRAMADOL HCL 50 MG PO TABS: 50.0000 mg | ORAL_TABLET | Freq: Four times a day (QID) | ORAL | Status: DC | PRN

## 2013-09-29 NOTE — Telephone Encounter (Addendum)
Pt's dtr, Lupita Leash states pt is in a lot of pain after fall Sunday, unable to move form bed to the bathroom or even short distance.  Pt is wearing the medical shoe given yesterday, but is still in pain.  Could Dr Al Corpus prescribe something for pain? Dr Al Corpus states ask Dr Leeanne Deed, since he last evaluated pt.  Dr Leeanne Deed ordered Tramadol 50mg  #20 one tablet po q 6 hrs prn pain 0 refills.

## 2013-09-30 NOTE — Telephone Encounter (Signed)
You may refill the tramadol 50 mg #50 with no refills.

## 2013-10-05 ENCOUNTER — Ambulatory Visit (INDEPENDENT_AMBULATORY_CARE_PROVIDER_SITE_OTHER): Payer: Medicare Other | Admitting: Podiatry

## 2013-10-05 ENCOUNTER — Encounter: Payer: Self-pay | Admitting: Podiatry

## 2013-10-05 VITALS — BP 154/62 | HR 86 | Resp 12

## 2013-10-05 DIAGNOSIS — S93609A Unspecified sprain of unspecified foot, initial encounter: Secondary | ICD-10-CM

## 2013-10-05 NOTE — Progress Notes (Signed)
   Subjective:    Patient ID: Glen BarrenSamuel V Turley, male    DOB: 1928-05-12, 80 y.o.   MRN: [id number]  HPI Comments: '' LT FOOT DOING MUCH BETTER.''  Foot Pain   Patient presents with care giver at their request for followup visit for accidental injury occurring on 09/27/2013. States the left foot is considerably better at this time.    Review of Systems     Objective:   Physical Exam Patient appears responsive to questioning.  Vascular: DP and PT pulses are two over four bilaterally  Dermatological: No skin lesions are noted on the left foot. Mild nonpitting edema noted left foot and toe area with maximum edema noted in the second left toe.       Assessment & Plan:   Assessment: Sprain left foot with reducing symptoms  Plan: Patient will continue to minimize standing and walking and ambulate with existing surgical shoe until all symptoms are resolved. Reappoint at patient's request.

## 2013-11-17 ENCOUNTER — Ambulatory Visit: Payer: Medicare Other | Admitting: Podiatry

## 2013-12-03 ENCOUNTER — Other Ambulatory Visit: Payer: Self-pay | Admitting: Physician Assistant

## 2013-12-08 ENCOUNTER — Encounter: Payer: Self-pay | Admitting: Podiatry

## 2013-12-08 ENCOUNTER — Ambulatory Visit (INDEPENDENT_AMBULATORY_CARE_PROVIDER_SITE_OTHER): Payer: Medicare Other | Admitting: Podiatry

## 2013-12-08 VITALS — BP 124/60 | HR 65 | Resp 18

## 2013-12-08 DIAGNOSIS — M79609 Pain in unspecified limb: Secondary | ICD-10-CM

## 2013-12-08 DIAGNOSIS — B351 Tinea unguium: Secondary | ICD-10-CM

## 2013-12-08 MED ORDER — ECONAZOLE NITRATE 1 % EX CREA: TOPICAL_CREAM | Freq: Every day | CUTANEOUS | Status: DC

## 2013-12-08 NOTE — Progress Notes (Signed)
He presents today with a chief complaint of painful ingrown toenails one through 5 bilateral.  Objective: Pulses are palpable bilateral. Nails are thick yellow dystrophic lytic mycotic and painful palpation.  Assessment: Pain in limb secondary to

## 2014-03-30 ENCOUNTER — Ambulatory Visit: Payer: Medicare Other | Admitting: Podiatry

## 2014-05-11 ENCOUNTER — Ambulatory Visit (INDEPENDENT_AMBULATORY_CARE_PROVIDER_SITE_OTHER): Payer: Medicare Other | Admitting: Podiatry

## 2014-05-11 ENCOUNTER — Encounter: Payer: Self-pay | Admitting: Podiatry

## 2014-05-11 DIAGNOSIS — E1140 Type 2 diabetes mellitus with diabetic neuropathy, unspecified: Secondary | ICD-10-CM

## 2014-05-11 DIAGNOSIS — M79673 Pain in unspecified foot: Secondary | ICD-10-CM

## 2014-05-11 DIAGNOSIS — E1142 Type 2 diabetes mellitus with diabetic polyneuropathy: Secondary | ICD-10-CM

## 2014-05-11 DIAGNOSIS — E1149 Type 2 diabetes mellitus with other diabetic neurological complication: Secondary | ICD-10-CM

## 2014-05-11 DIAGNOSIS — B351 Tinea unguium: Secondary | ICD-10-CM

## 2014-05-11 DIAGNOSIS — M79609 Pain in unspecified limb: Secondary | ICD-10-CM

## 2014-05-11 MED ORDER — ECONAZOLE NITRATE 1 % EX CREA: TOPICAL_CREAM | Freq: Every day | CUTANEOUS | Status: DC

## 2014-05-11 NOTE — Progress Notes (Signed)
He presents today chief complaint of painful elongated toenails and dry scaly skin.  Objective: Vital signs are stable alert and x3. His nails are thick yellow dystrophic onychomycotic and painful palpation. Pulses are palpable bilateral. Tinea pedis bilateral foot.  Assessment: Tinea pedis and pain in limb secondary to onychomycosis.  Plan: Debridement of all reactive hyperkeratosis debridement of nails 1 through 5 bilateral. And refill his econazole.

## 2014-05-11 NOTE — Patient Instructions (Signed)
Diabetes and Foot Care Diabetes may cause you to have problems because of poor blood supply (circulation) to your feet and legs. This may cause the skin on your feet to become thinner, break easier, and heal more slowly. Your skin may become dry, and the skin may peel and crack. You may also have nerve damage in your legs and feet causing decreased feeling in them. You may not notice minor injuries to your feet that could lead to infections or more serious problems. Taking care of your feet is one of the most important things you can do for yourself.  HOME CARE INSTRUCTIONS  Wear shoes at all times, even in the house. Do not go barefoot. Bare feet are easily injured.  Check your feet daily for blisters, cuts, and redness. If you cannot see the bottom of your feet, use a mirror or ask someone for help.  Wash your feet with warm water (do not use hot water) and mild soap. Then pat your feet and the areas between your toes until they are completely dry. Do not soak your feet as this can dry your skin.  Apply a moisturizing lotion or petroleum jelly (that does not contain alcohol and is unscented) to the skin on your feet and to dry, brittle toenails. Do not apply lotion between your toes.  Trim your toenails straight across. Do not dig under them or around the cuticle. File the edges of your nails with an emery board or nail file.  Do not cut corns or calluses or try to remove them with medicine.  Wear clean socks or stockings every day. Make sure they are not too tight. Do not wear knee-high stockings since they may decrease blood flow to your legs.  Wear shoes that fit properly and have enough cushioning. To break in new shoes, wear them for just a few hours a day. This prevents you from injuring your feet. Always look in your shoes before you put them on to be sure there are no objects inside.  Do not cross your legs. This may decrease the blood flow to your feet.  If you find a minor scrape,  cut, or break in the skin on your feet, keep it and the skin around it clean and dry. These areas may be cleansed with mild soap and water. Do not cleanse the area with peroxide, alcohol, or iodine.  When you remove an adhesive bandage, be sure not to damage the skin around it.  If you have a wound, look at it several times a day to make sure it is healing.  Do not use heating pads or hot water bottles. They may burn your skin. If you have lost feeling in your feet or legs, you may not know it is happening until it is too late.  Make sure your health care provider performs a complete foot exam at least annually or more often if you have foot problems. Report any cuts, sores, or bruises to your health care provider immediately. SEEK MEDICAL CARE IF:   You have an injury that is not healing.  You have cuts or breaks in the skin.  You have an ingrown nail.  You notice redness on your legs or feet.  You feel burning or tingling in your legs or feet.  You have pain or cramps in your legs and feet.  Your legs or feet are numb.  Your feet always feel cold. SEEK IMMEDIATE MEDICAL CARE IF:   There is increasing redness,   swelling, or pain in or around a wound.  There is a red line that goes up your leg.  Pus is coming from a wound.  You develop a fever or as directed by your health care provider.  You notice a bad smell coming from an ulcer or wound. Document Released: 09/14/2000 Document Revised: 05/20/2013 Document Reviewed: 02/24/2013 [employer] Patient Information 2015 [employer]. This information is not intended to replace advice given to you by your health care provider. Make sure you discuss any questions you have with your health care provider.  

## 2014-08-16 ENCOUNTER — Emergency Department (HOSPITAL_COMMUNITY)
Admission: EM | Admit: 2014-08-16 | Discharge: 2014-08-17 | Disposition: A | Discharge status: Home / Self Care | Payer: Medicare Other | Attending: Emergency Medicine | Admitting: Emergency Medicine

## 2014-08-16 ENCOUNTER — Encounter (HOSPITAL_COMMUNITY): Payer: Self-pay

## 2014-08-16 ENCOUNTER — Emergency Department (HOSPITAL_COMMUNITY): Payer: Medicare Other

## 2014-08-16 DIAGNOSIS — Z79899 Other long term (current) drug therapy: Secondary | ICD-10-CM | POA: Diagnosis not present

## 2014-08-16 DIAGNOSIS — G8929 Other chronic pain: Secondary | ICD-10-CM | POA: Diagnosis not present

## 2014-08-16 DIAGNOSIS — J449 Chronic obstructive pulmonary disease, unspecified: Secondary | ICD-10-CM | POA: Insufficient documentation

## 2014-08-16 DIAGNOSIS — F0390 Unspecified dementia without behavioral disturbance: Secondary | ICD-10-CM | POA: Diagnosis not present

## 2014-08-16 DIAGNOSIS — Z87891 Personal history of nicotine dependence: Secondary | ICD-10-CM | POA: Insufficient documentation

## 2014-08-16 DIAGNOSIS — M1990 Unspecified osteoarthritis, unspecified site: Secondary | ICD-10-CM | POA: Insufficient documentation

## 2014-08-16 DIAGNOSIS — E119 Type 2 diabetes mellitus without complications: Secondary | ICD-10-CM | POA: Insufficient documentation

## 2014-08-16 DIAGNOSIS — I10 Essential (primary) hypertension: Secondary | ICD-10-CM | POA: Insufficient documentation

## 2014-08-16 DIAGNOSIS — Z7952 Long term (current) use of systemic steroids: Secondary | ICD-10-CM | POA: Insufficient documentation

## 2014-08-16 DIAGNOSIS — Z794 Long term (current) use of insulin: Secondary | ICD-10-CM | POA: Diagnosis not present

## 2014-08-16 DIAGNOSIS — M25561 Pain in right knee: Secondary | ICD-10-CM | POA: Diagnosis present

## 2014-08-16 DIAGNOSIS — Z9889 Other specified postprocedural states: Secondary | ICD-10-CM | POA: Insufficient documentation

## 2014-08-16 LAB — CBC WITH DIFFERENTIAL/PLATELET
BASOS ABS: 0 10*3/uL (ref 0.0–0.1)
Basophils Relative: 0 % (ref 0–1)
EOS PCT: 1 % (ref 0–5)
Eosinophils Absolute: 0.1 10*3/uL (ref 0.0–0.7)
HCT: 32.1 % — ABNORMAL LOW (ref 39.0–52.0)
Hemoglobin: 9.6 g/dL — ABNORMAL LOW (ref 13.0–17.0)
LYMPHS ABS: 1.3 10*3/uL (ref 0.7–4.0)
LYMPHS PCT: 13 % (ref 12–46)
MCH: 25.8 pg — ABNORMAL LOW (ref 26.0–34.0)
MCHC: 29.9 g/dL — ABNORMAL LOW (ref 30.0–36.0)
MCV: 86.3 fL (ref 78.0–100.0)
Monocytes Absolute: 0.8 10*3/uL (ref 0.1–1.0)
Monocytes Relative: 8 % (ref 3–12)
NEUTROS PCT: 78 % — AB (ref 43–77)
Neutro Abs: 8.1 10*3/uL — ABNORMAL HIGH (ref 1.7–7.7)
PLATELETS: 250 10*3/uL (ref 150–400)
RBC: 3.72 MIL/uL — AB (ref 4.22–5.81)
RDW: 19.1 % — AB (ref 11.5–15.5)
WBC: 10.3 10*3/uL (ref 4.0–10.5)

## 2014-08-16 LAB — SYNOVIAL CELL COUNT + DIFF, W/ CRYSTALS
Crystals, Fluid: NONE SEEN
EOSINOPHILS-SYNOVIAL: 0 % (ref 0–1)
Lymphocytes-Synovial Fld: 13 % (ref 0–20)
Monocyte-Macrophage-Synovial Fluid: 3 % — ABNORMAL LOW (ref 50–90)
Neutrophil, Synovial: 51 % — ABNORMAL HIGH (ref 0–25)
OTHER CELLS-SYN: 0

## 2014-08-16 LAB — CBG MONITORING, ED: Glucose-Capillary: 68 mg/dL — ABNORMAL LOW (ref 70–99)

## 2014-08-16 LAB — BASIC METABOLIC PANEL
ANION GAP: 10 (ref 5–15)
BUN: 18 mg/dL (ref 6–23)
CALCIUM: 7.9 mg/dL — AB (ref 8.4–10.5)
CO2: 37 meq/L — AB (ref 19–32)
Chloride: 96 mEq/L (ref 96–112)
Creatinine, Ser: 1.15 mg/dL (ref 0.50–1.35)
GFR calc Af Amer: 66 mL/min — ABNORMAL LOW (ref >90)
GFR, EST NON AFRICAN AMERICAN: 57 mL/min — AB (ref >90)
Glucose, Bld: 78 mg/dL (ref 70–99)
POTASSIUM: 3.4 meq/L — AB (ref 3.7–5.3)
SODIUM: 143 meq/L (ref 137–147)

## 2014-08-16 MED ORDER — LIDOCAINE-EPINEPHRINE 2 %-1:200000 IJ SOLN
INTRAMUSCULAR | Status: AC
Start: 2014-08-16 — End: 2014-08-17
  Administered 2014-08-17: 20 mL
  Filled 2014-08-16: qty 20

## 2014-08-16 MED ORDER — OXYCODONE-ACETAMINOPHEN 5-325 MG PO TABS
1.0000 | ORAL_TABLET | Freq: Once | ORAL | Status: AC
  Administered 2014-08-16: 1 via ORAL
  Filled 2014-08-16: qty 1

## 2014-08-16 NOTE — ED Notes (Signed)
[facility] presents with a  80 yo from home with right knee swelling and pain.  Pt family consulted with PCP in which advised them to call [facility] because could not walk on swollen and painful right knee.  Pt has hx of dementia and atrial fib.  No known hx of gout, no recent falls no obvious trauma to right knee.  VS stable.

## 2014-08-16 NOTE — ED Provider Notes (Signed)
CSN: [id number]     Arrival date & time 08/16/14  1710 History   First MD Initiated Contact with Patient 08/16/14 1820     Chief Complaint  Patient presents with  . Knee Pain     (Consider location/radiation/quality/duration/timing/severity/associated sxs/prior Treatment) Patient is a 80 y.o. male presenting with knee pain.  Knee Pain Location:  Knee Injury: no   Knee location:  R knee Pain details:    Quality:  Aching   Radiates to:  Does not radiate   Severity:  Moderate   Onset quality:  Gradual   Duration: chronically with acute worsening over last 2 days.   Timing:  Constant   Progression:  Worsening Chronicity:  New Dislocation: no   Relieved by:  Nothing Worsened by:  Bearing weight, extension and flexion Ineffective treatments:  None tried Associated symptoms: decreased ROM and swelling   Associated symptoms: no fever, no muscle weakness and no numbness     Past Medical History  Diagnosis Date  . Hypertension   . Diabetes mellitus   . COPD (chronic obstructive pulmonary disease)   . Dementia    Past Surgical History  Procedure Laterality Date  . Kidney stones    . Back surgery    . Exploration post operative open heart    . Triple bypass    . Cholecystectomy    . Knee arthroscopy    . Appendectomy    . Eye surgery    . Joint replacement     History reviewed. No pertinent family history. History  Substance Use Topics  . Smoking status: Former Research scientist (life sciences)  . Smokeless tobacco: Not on file  . Alcohol Use: No    Review of Systems  Constitutional: Negative for fever.  All other systems reviewed and are negative.     Allergies  Review of patient's allergies indicates no known allergies.  Home Medications   Prior to Admission medications   Medication Sig Start Date End Date Taking? Authorizing Provider  B-D INS SYRINGE 0.5CC/31GX5/16 31G X 5/16" 0.5 ML MISC  11/30/13  Yes Historical Provider, MD  Calcium Carbonate-Vitamin D (CALCIUM 600/VITAMIN D)  600-400 MG-UNIT per tablet Take 1 tablet by mouth 2 (two) times daily.   Yes Historical Provider, MD  cetirizine (ZYRTEC) 10 MG tablet Take 10 mg by mouth at bedtime.   Yes Historical Provider, MD  clobetasol cream (TEMOVATE) 3.35 % Apply 1 application topically 2 (two) times daily as needed (skin outbreak).  11/18/13  Yes Historical Provider, MD  econazole nitrate 1 % cream Apply topically daily. Apply to affected area twice daily. Patient taking differently: Apply 1 application topically daily as needed (skin outbreak). Apply to affected area twice daily. 05/11/14  Yes Max T Hyatt, DPM  ezetimibe-simvastatin (VYTORIN) 10-40 MG per tablet Take 1 tablet by mouth daily.   Yes Historical Provider, MD  FLUoxetine (PROZAC) 20 MG capsule Take 40 mg by mouth daily.   Yes Historical Provider, MD  furosemide (LASIX) 40 MG tablet Take 40 mg by mouth daily.   Yes Historical Provider, MD  gabapentin (NEURONTIN) 400 MG capsule Take 400 mg by mouth 2 (two) times daily.   Yes Historical Provider, MD  hydrOXYzine (ATARAX/VISTARIL) 25 MG tablet Take 25 mg by mouth 2 (two) times daily as needed for itching (itching).  11/18/13  Yes Historical Provider, MD  insulin glargine (LANTUS) 100 UNIT/ML injection Inject 30 Units into the skin at bedtime.   Yes Historical Provider, MD  isosorbide mononitrate (IMDUR) 60 MG 24  hr tablet Take 60 mg by mouth daily.   Yes Historical Provider, MD  losartan (COZAAR) 50 MG tablet Take 50 mg by mouth daily.   Yes Historical Provider, MD  MAGNESIUM PO Take 1 tablet by mouth daily.   Yes Historical Provider, MD  memantine (NAMENDA) 10 MG tablet Take 10 mg by mouth 2 (two) times daily.   Yes Historical Provider, MD  metFORMIN (GLUCOPHAGE) 500 MG tablet Take 500 mg by mouth 2 (two) times daily with a meal.   Yes Historical Provider, MD  metoprolol succinate (TOPROL-XL) 25 MG 24 hr tablet Take 25 mg by mouth 2 (two) times daily.   Yes Historical Provider, MD  omeprazole (PRILOSEC) 20 MG  capsule Take 20 mg by mouth daily.   Yes Historical Provider, MD  potassium chloride SA (K-DUR,KLOR-CON) 20 MEQ tablet Take 20 mEq by mouth 2 (two) times daily.   Yes Historical Provider, MD  SPIRIVA HANDIHALER 18 MCG inhalation capsule Place 18 mcg into inhaler and inhale daily as needed (copd).  09/01/13  Yes Historical Provider, MD  acetaminophen (TYLENOL) 500 MG tablet Take 1,000 mg by mouth every 6 (six) hours as needed. For pain    Historical Provider, MD  celecoxib (CELEBREX) 200 MG capsule Take 200 mg by mouth daily.    Historical Provider, MD  nitroGLYCERIN (NITROLINGUAL) 0.4 MG/SPRAY spray Place 1 spray under the tongue every 5 (five) minutes as needed. For chest pain    Historical Provider, MD  oxyCODONE-acetaminophen (PERCOCET/ROXICET) 5-325 MG per tablet Take 1 tablet by mouth every 4 (four) hours as needed for severe pain. 08/17/14   Debby Freiberg, MD  predniSONE (DELTASONE) 10 MG tablet  09/01/13   Historical Provider, MD  Tamsulosin HCl (FLOMAX) 0.4 MG CAPS Take 0.4 mg by mouth every evening.    Historical Provider, MD  traMADol (ULTRAM) 50 MG tablet Take 1 tablet (50 mg total) by mouth every 6 (six) hours as needed. 09/29/13   Kendell Bane, DPM  triamcinolone cream (KENALOG) 0.1 % Apply 1 application topically 2 (two) times daily as needed (skin outbreak).  12/03/13   Historical Provider, MD  ZONALON 5 % cream  12/01/13   Historical Provider, MD   BP 147/85 mmHg  Pulse 75  Temp(Src) 98 F (36.7 C) (Oral)  Resp 18  SpO2 96% Physical Exam  Constitutional: He is oriented to person, place, and time. He appears well-developed and well-nourished.  HENT:  Head: Normocephalic and atraumatic.  Eyes: Conjunctivae and EOM are normal.  Neck: Normal range of motion. Neck supple.  Cardiovascular: Normal rate, regular rhythm and normal heart sounds.   Pulmonary/Chest: Effort normal and breath sounds normal. No respiratory distress.  Abdominal: He exhibits no distension. There is no  tenderness. There is no rebound and no guarding.  Musculoskeletal:       Right knee: He exhibits decreased range of motion (2/2 to pain), swelling and effusion. He exhibits no erythema. Tenderness found.  Neurological: He is alert and oriented to person, place, and time.  Skin: Skin is warm and dry.  Vitals reviewed.   ED Course  ARTHOCENTESIS Date/Time: 08/17/2014 12:26 PM Performed by: Debby Freiberg Authorized by: Debby Freiberg Consent: Verbal consent obtained. Indications: joint swelling,  pain,  possible septic joint and diagnostic evaluation  Body area: knee Joint: right knee Local anesthesia used: yes Anesthesia: local infiltration Local anesthetic: lidocaine 1% with epinephrine Preparation: Patient was prepped and draped in the usual sterile fashion. Needle gauge: 18 G Ultrasound guidance: no Approach: lateral  Aspirate: cloudy and yellow Aspirate amount: 60 mL Patient tolerance: Patient tolerated the procedure well with no immediate complications   (including critical care time) Labs Review Labs Reviewed  CBC WITH DIFFERENTIAL - Abnormal; Notable for the following:    RBC 3.72 (*)    Hemoglobin 9.6 (*)    HCT 32.1 (*)    MCH 25.8 (*)    MCHC 29.9 (*)    RDW 19.1 (*)    Neutrophils Relative % 78 (*)    Neutro Abs 8.1 (*)    All other components within normal limits  BASIC METABOLIC PANEL - Abnormal; Notable for the following:    Potassium 3.4 (*)    CO2 37 (*)    Calcium 7.9 (*)    GFR calc non Af Amer 57 (*)    GFR calc Af Amer 66 (*)    All other components within normal limits  SYNOVIAL CELL COUNT + DIFF, W/ CRYSTALS - Abnormal; Notable for the following:    Appearance-Synovial TURBID (*)    Neutrophil, Synovial 51 (*)    Monocyte-Macrophage-Synovial Fluid 3 (*)    All other components within normal limits  SEDIMENTATION RATE - Abnormal; Notable for the following:    Sed Rate 40 (*)    All other components within normal limits  C-REACTIVE PROTEIN -  Abnormal; Notable for the following:    CRP 13.9 (*)    All other components within normal limits  CBG MONITORING, ED - Abnormal; Notable for the following:    Glucose-Capillary 68 (*)    All other components within normal limits  CBG MONITORING, ED - Abnormal; Notable for the following:    Glucose-Capillary 125 (*)    All other components within normal limits  BODY FLUID CULTURE    Imaging Review Dg Knee Complete 4 Views Right  08/16/2014   CLINICAL DATA:  RIGHT knee swelling and pain, unable to walk, no known recent trauma, history hypertension, diabetes, COPD, dementia  EXAM: RIGHT KNEE - COMPLETE 4+ VIEW  COMPARISON:  None  FINDINGS: Osseous demineralization.  Tricompartmental osteoarthritic changes with joint space narrowing and spur formation, greatest at patellofemoral joint.  Chondrocalcinosis.  No acute fracture, dislocation or bone destruction.  Question calcified loose body suprapatellar.  Scattered surgical clips at medial proximal RIGHT lower leg.  No significant knee joint effusion.  IMPRESSION: Osteoarthritic changes RIGHT knee with question CPPD/pseudogout.  Question calcified loose body suprapatellar.   Electronically Signed   By: Lavonia Dana M.D.   On: 08/16/2014 22:09     EKG Interpretation None      MDM   Final diagnoses:  Right knee pain  Arthritis    80 y.o. male with pertinent PMH of DM, HTN, OA, Dementia presents with acute on chronic R knee pain.  Symptoms ongoing for some time, but pt refused to stand today.  He has a baseline ho inability to walk and is wheelchair bound.  No fevers, systemic symptoms.  On arrival vitals and physical exam as above.  Knee not erythematous, but otherwise with LROM.  With ho DM, tapped knee.  Unfortunately sample clotted.  After aspiration, pt was able to bear weight and had FROM.  Given lack of WBC, no systemic symptoms, consider septic arthritis unlikely.  Mildly elevated ESR likely due to inflammatory response.  Spoke with  orthopedics on call who agreed with plan and thought fu appropriate.  DC home in stable condition without abx to ensure that pt would return if infectious signs developed, which  was stressed to the family. Family agreed with plan and requested to take the pt home.  1. Arthritis   2. Right knee pain         Debby Freiberg, MD 08/17/14 [phone]

## 2014-08-16 NOTE — ED Notes (Signed)
Bed: WA03 Expected date:  Expected time:  Means of arrival:  Comments: ems 

## 2014-08-17 ENCOUNTER — Ambulatory Visit: Payer: Medicare Other | Admitting: Podiatry

## 2014-08-17 LAB — C-REACTIVE PROTEIN: CRP: 13.9 mg/dL — ABNORMAL HIGH (ref <0.60)

## 2014-08-17 LAB — SEDIMENTATION RATE: SED RATE: 40 mm/h — AB (ref 0–16)

## 2014-08-17 LAB — CBG MONITORING, ED: Glucose-Capillary: 125 mg/dL — ABNORMAL HIGH (ref 70–99)

## 2014-08-17 MED ORDER — DEXTROSE 50 % IV SOLN
INTRAVENOUS | Status: AC
  Administered 2014-08-17: 50 mL via INTRAVENOUS
  Filled 2014-08-17: qty 50

## 2014-08-17 MED ORDER — OXYCODONE-ACETAMINOPHEN 5-325 MG PO TABS: 1.0000 | ORAL_TABLET | ORAL | Status: DC | PRN

## 2014-08-17 MED ORDER — DEXTROSE 50 % IV SOLN
1.0000 | Freq: Once | INTRAVENOUS | Status: AC
  Administered 2014-08-17: 50 mL via INTRAVENOUS

## 2014-08-17 NOTE — Discharge Instructions (Signed)
Arthritis, Nonspecific °Arthritis is inflammation of a joint. This usually means pain, redness, warmth or swelling are present. One or more joints may be involved. There are a number of types of arthritis. Your caregiver may not be able to tell what type of arthritis you have right away. °CAUSES  °The most common cause of arthritis is the wear and tear on the joint (osteoarthritis). This causes damage to the cartilage, which can break down over time. The knees, hips, back and neck are most often affected by this type of arthritis. °Other types of arthritis and common causes of joint pain include: °· Sprains and other injuries near the joint. Sometimes minor sprains and injuries cause pain and swelling that develop hours later. °· Rheumatoid arthritis. This affects hands, feet and knees. It usually affects both sides of your body at the same time. It is often associated with chronic ailments, fever, weight loss and general weakness. °· Crystal arthritis. Gout and pseudo gout can cause occasional acute severe pain, redness and swelling in the foot, ankle, or knee. °· Infectious arthritis. Bacteria can get into a joint through a break in overlying skin. This can cause infection of the joint. Bacteria and viruses can also spread through the blood and affect your joints. °· Drug, infectious and allergy reactions. Sometimes joints can become mildly painful and slightly swollen with these types of illnesses. °SYMPTOMS  °· Pain is the main symptom. °· Your joint or joints can also be red, swollen and warm or hot to the touch. °· You may have a fever with certain types of arthritis, or even feel overall ill. °· The joint with arthritis will hurt with movement. Stiffness is present with some types of arthritis. °DIAGNOSIS  °Your caregiver will suspect arthritis based on your description of your symptoms and on your exam. Testing may be needed to find the type of arthritis: °· Blood and sometimes urine tests. °· X-ray tests  and sometimes CT or MRI scans. °· Removal of fluid from the joint (arthrocentesis) is done to check for bacteria, crystals or other causes. Your caregiver (or a specialist) will numb the area over the joint with a local anesthetic, and use a needle to remove joint fluid for examination. This procedure is only minimally uncomfortable. °· Even with these tests, your caregiver may not be able to tell what kind of arthritis you have. Consultation with a specialist (rheumatologist) may be helpful. °TREATMENT  °Your caregiver will discuss with you treatment specific to your type of arthritis. If the specific type cannot be determined, then the following general recommendations may apply. °Treatment of severe joint pain includes: °· Rest. °· Elevation. °· Anti-inflammatory medication (for example, ibuprofen) may be prescribed. Avoiding activities that cause increased pain. °· Only take over-the-counter or prescription medicines for pain and discomfort as recommended by your caregiver. °· Cold packs over an inflamed joint may be used for 10 to 15 minutes every hour. Hot packs sometimes feel better, but do not use overnight. Do not use hot packs if you are diabetic without your caregiver's permission. °· A cortisone shot into arthritic joints may help reduce pain and swelling. °· Any acute arthritis that gets worse over the next 1 to 2 days needs to be looked at to be sure there is no joint infection. °Long-term arthritis treatment involves modifying activities and lifestyle to reduce joint stress jarring. This can include weight loss. Also, exercise is needed to nourish the joint cartilage and remove waste. This helps keep the muscles   around the joint strong. °HOME CARE INSTRUCTIONS  °· Do not take aspirin to relieve pain if gout is suspected. This elevates uric acid levels. °· Only take over-the-counter or prescription medicines for pain, discomfort or fever as directed by your caregiver. °· Rest the joint as much as  possible. °· If your joint is swollen, keep it elevated. °· Use crutches if the painful joint is in your leg. °· Drinking plenty of fluids may help for certain types of arthritis. °· Follow your caregiver's dietary instructions. °· Try low-impact exercise such as: °¨ Swimming. °¨ Water aerobics. °¨ Biking. °¨ Walking. °· Morning stiffness is often relieved by a warm shower. °· Put your joints through regular range-of-motion. °SEEK MEDICAL CARE IF:  °· You do not feel better in 24 hours or are getting worse. °· You have side effects to medications, or are not getting better with treatment. °SEEK IMMEDIATE MEDICAL CARE IF:  °· You have a fever. °· You develop severe joint pain, swelling or redness. °· Many joints are involved and become painful and swollen. °· There is severe back pain and/or leg weakness. °· You have loss of bowel or bladder control. °Document Released: 10/25/2004 Document Revised: 12/10/2011 Document Reviewed: 11/10/2008 °[employer]® Patient Information ©2015 [employer]. This information is not intended to replace advice given to you by your health care provider. Make sure you discuss any questions you have with your health care provider. ° °

## 2014-08-19 ENCOUNTER — Ambulatory Visit: Payer: Medicare Other

## 2014-08-20 LAB — BODY FLUID CULTURE
CULTURE: NO GROWTH
Special Requests: NORMAL

## 2014-09-09 ENCOUNTER — Ambulatory Visit: Payer: Medicare Other

## 2014-09-12 ENCOUNTER — Emergency Department (HOSPITAL_COMMUNITY): Payer: Medicare Other

## 2014-09-12 ENCOUNTER — Encounter (HOSPITAL_COMMUNITY): Payer: Self-pay | Admitting: *Deleted

## 2014-09-12 ENCOUNTER — Inpatient Hospital Stay (HOSPITAL_COMMUNITY)
Admission: EM | Admit: 2014-09-12 | Discharge: 2014-09-20 | DRG: 100 | Disposition: A | Discharge status: Skilled Nursing Facility | Payer: Medicare Other | Attending: Internal Medicine | Admitting: Internal Medicine

## 2014-09-12 DIAGNOSIS — Z7952 Long term (current) use of systemic steroids: Secondary | ICD-10-CM

## 2014-09-12 DIAGNOSIS — J690 Pneumonitis due to inhalation of food and vomit: Secondary | ICD-10-CM | POA: Diagnosis present

## 2014-09-12 DIAGNOSIS — I482 Chronic atrial fibrillation, unspecified: Secondary | ICD-10-CM | POA: Diagnosis present

## 2014-09-12 DIAGNOSIS — E860 Dehydration: Secondary | ICD-10-CM | POA: Diagnosis present

## 2014-09-12 DIAGNOSIS — E0865 Diabetes mellitus due to underlying condition with hyperglycemia: Secondary | ICD-10-CM | POA: Insufficient documentation

## 2014-09-12 DIAGNOSIS — G4089 Other seizures: Secondary | ICD-10-CM | POA: Diagnosis present

## 2014-09-12 DIAGNOSIS — I10 Essential (primary) hypertension: Secondary | ICD-10-CM | POA: Diagnosis present

## 2014-09-12 DIAGNOSIS — I712 Thoracic aortic aneurysm, without rupture: Secondary | ICD-10-CM | POA: Diagnosis present

## 2014-09-12 DIAGNOSIS — E119 Type 2 diabetes mellitus without complications: Secondary | ICD-10-CM | POA: Diagnosis present

## 2014-09-12 DIAGNOSIS — J449 Chronic obstructive pulmonary disease, unspecified: Secondary | ICD-10-CM | POA: Diagnosis present

## 2014-09-12 DIAGNOSIS — F0390 Unspecified dementia without behavioral disturbance: Secondary | ICD-10-CM | POA: Diagnosis present

## 2014-09-12 DIAGNOSIS — Z7401 Bed confinement status: Secondary | ICD-10-CM

## 2014-09-12 DIAGNOSIS — I472 Ventricular tachycardia: Secondary | ICD-10-CM | POA: Diagnosis present

## 2014-09-12 DIAGNOSIS — R197 Diarrhea, unspecified: Secondary | ICD-10-CM | POA: Diagnosis not present

## 2014-09-12 DIAGNOSIS — I4891 Unspecified atrial fibrillation: Secondary | ICD-10-CM

## 2014-09-12 DIAGNOSIS — Z9049 Acquired absence of other specified parts of digestive tract: Secondary | ICD-10-CM | POA: Diagnosis present

## 2014-09-12 DIAGNOSIS — Z87442 Personal history of urinary calculi: Secondary | ICD-10-CM

## 2014-09-12 DIAGNOSIS — D649 Anemia, unspecified: Secondary | ICD-10-CM | POA: Diagnosis present

## 2014-09-12 DIAGNOSIS — E8770 Fluid overload, unspecified: Secondary | ICD-10-CM | POA: Diagnosis present

## 2014-09-12 DIAGNOSIS — G9340 Encephalopathy, unspecified: Secondary | ICD-10-CM | POA: Diagnosis present

## 2014-09-12 DIAGNOSIS — Z66 Do not resuscitate: Secondary | ICD-10-CM | POA: Diagnosis present

## 2014-09-12 DIAGNOSIS — E870 Hyperosmolality and hypernatremia: Secondary | ICD-10-CM | POA: Diagnosis present

## 2014-09-12 DIAGNOSIS — R569 Unspecified convulsions: Secondary | ICD-10-CM | POA: Insufficient documentation

## 2014-09-12 DIAGNOSIS — R310 Gross hematuria: Secondary | ICD-10-CM | POA: Diagnosis not present

## 2014-09-12 DIAGNOSIS — J189 Pneumonia, unspecified organism: Secondary | ICD-10-CM | POA: Diagnosis present

## 2014-09-12 DIAGNOSIS — Z951 Presence of aortocoronary bypass graft: Secondary | ICD-10-CM

## 2014-09-12 DIAGNOSIS — R339 Retention of urine, unspecified: Secondary | ICD-10-CM | POA: Diagnosis not present

## 2014-09-12 DIAGNOSIS — Z9660 Presence of unspecified orthopedic joint implant: Secondary | ICD-10-CM | POA: Diagnosis present

## 2014-09-12 DIAGNOSIS — E876 Hypokalemia: Secondary | ICD-10-CM | POA: Diagnosis not present

## 2014-09-12 DIAGNOSIS — R600 Localized edema: Secondary | ICD-10-CM | POA: Diagnosis not present

## 2014-09-12 DIAGNOSIS — Z87891 Personal history of nicotine dependence: Secondary | ICD-10-CM | POA: Diagnosis not present

## 2014-09-12 DIAGNOSIS — R0602 Shortness of breath: Secondary | ICD-10-CM

## 2014-09-12 DIAGNOSIS — Z79899 Other long term (current) drug therapy: Secondary | ICD-10-CM

## 2014-09-12 DIAGNOSIS — Z794 Long term (current) use of insulin: Secondary | ICD-10-CM

## 2014-09-12 DIAGNOSIS — D62 Acute posthemorrhagic anemia: Secondary | ICD-10-CM | POA: Clinically undetermined

## 2014-09-12 DIAGNOSIS — R609 Edema, unspecified: Secondary | ICD-10-CM | POA: Diagnosis not present

## 2014-09-12 DIAGNOSIS — I2510 Atherosclerotic heart disease of native coronary artery without angina pectoris: Secondary | ICD-10-CM | POA: Diagnosis present

## 2014-09-12 DIAGNOSIS — R319 Hematuria, unspecified: Secondary | ICD-10-CM

## 2014-09-12 DIAGNOSIS — I714 Abdominal aortic aneurysm, without rupture, unspecified: Secondary | ICD-10-CM | POA: Diagnosis present

## 2014-09-12 HISTORY — DX: Abdominal aortic aneurysm, without rupture: I71.4

## 2014-09-12 HISTORY — DX: Unspecified atrial fibrillation: I48.91

## 2014-09-12 LAB — CBC
HCT: 39 % (ref 39.0–52.0)
Hemoglobin: 11.4 g/dL — ABNORMAL LOW (ref 13.0–17.0)
MCH: 26.2 pg (ref 26.0–34.0)
MCHC: 29.2 g/dL — AB (ref 30.0–36.0)
MCV: 89.7 fL (ref 78.0–100.0)
Platelets: 171 10*3/uL (ref 150–400)
RBC: 4.35 MIL/uL (ref 4.22–5.81)
RDW: 17.8 % — ABNORMAL HIGH (ref 11.5–15.5)
WBC: 16.5 10*3/uL — ABNORMAL HIGH (ref 4.0–10.5)

## 2014-09-12 LAB — DIFFERENTIAL
BASOS PCT: 0 % (ref 0–1)
Basophils Absolute: 0 10*3/uL (ref 0.0–0.1)
Eosinophils Absolute: 0 10*3/uL (ref 0.0–0.7)
Eosinophils Relative: 0 % (ref 0–5)
LYMPHS ABS: 1 10*3/uL (ref 0.7–4.0)
Lymphocytes Relative: 6 % — ABNORMAL LOW (ref 12–46)
Monocytes Absolute: 0.9 10*3/uL (ref 0.1–1.0)
Monocytes Relative: 5 % (ref 3–12)
NEUTROS ABS: 14.6 10*3/uL — AB (ref 1.7–7.7)
NEUTROS PCT: 89 % — AB (ref 43–77)

## 2014-09-12 LAB — COMPREHENSIVE METABOLIC PANEL
ALBUMIN: 3 g/dL — AB (ref 3.5–5.2)
ALK PHOS: 60 U/L (ref 39–117)
ALT: 9 U/L (ref 0–53)
AST: 27 U/L (ref 0–37)
Anion gap: 31 — ABNORMAL HIGH (ref 5–15)
BUN: 19 mg/dL (ref 6–23)
CO2: 24 mEq/L (ref 19–32)
Calcium: 8.1 mg/dL — ABNORMAL LOW (ref 8.4–10.5)
Chloride: 95 mEq/L — ABNORMAL LOW (ref 96–112)
Creatinine, Ser: 1.26 mg/dL (ref 0.50–1.35)
GFR calc Af Amer: 59 mL/min — ABNORMAL LOW (ref >90)
GFR calc non Af Amer: 51 mL/min — ABNORMAL LOW (ref >90)
Glucose, Bld: 214 mg/dL — ABNORMAL HIGH (ref 70–99)
POTASSIUM: 3.8 meq/L (ref 3.7–5.3)
SODIUM: 150 meq/L — AB (ref 137–147)
Total Bilirubin: 0.7 mg/dL (ref 0.3–1.2)
Total Protein: 6.8 g/dL (ref 6.0–8.3)

## 2014-09-12 LAB — BLOOD GAS, ARTERIAL
ACID-BASE EXCESS: 0.2 mmol/L (ref 0.0–2.0)
Bicarbonate: 23.7 mEq/L (ref 20.0–24.0)
DRAWN BY: 276051
O2 CONTENT: 3 L/min
O2 Saturation: 94.2 %
PATIENT TEMPERATURE: 99.6
PCO2 ART: 37.4 mmHg (ref 35.0–45.0)
TCO2: 21.5 mmol/L (ref 0–100)
pH, Arterial: 7.422 (ref 7.350–7.450)
pO2, Arterial: 82.2 mmHg (ref 80.0–100.0)

## 2014-09-12 LAB — GLUCOSE, CAPILLARY
Glucose-Capillary: 184 mg/dL — ABNORMAL HIGH (ref 70–99)
Glucose-Capillary: 151 mg/dL — ABNORMAL HIGH (ref 70–99)

## 2014-09-12 LAB — RAPID URINE DRUG SCREEN, HOSP PERFORMED
AMPHETAMINES: NOT DETECTED
Barbiturates: NOT DETECTED
Benzodiazepines: NOT DETECTED
Cocaine: NOT DETECTED
Opiates: NOT DETECTED
TETRAHYDROCANNABINOL: NOT DETECTED

## 2014-09-12 LAB — I-STAT CHEM 8, ED
BUN: 21 mg/dL (ref 6–23)
CREATININE: 1.2 mg/dL (ref 0.50–1.35)
Calcium, Ion: 0.92 mmol/L — ABNORMAL LOW (ref 1.13–1.30)
Chloride: 98 mEq/L (ref 96–112)
Glucose, Bld: 210 mg/dL — ABNORMAL HIGH (ref 70–99)
HCT: 40 % (ref 39.0–52.0)
Hemoglobin: 13.6 g/dL (ref 13.0–17.0)
POTASSIUM: 3.6 meq/L — AB (ref 3.7–5.3)
SODIUM: 147 meq/L (ref 137–147)
TCO2: 24 mmol/L (ref 0–100)

## 2014-09-12 LAB — PROTIME-INR
INR: 1.34 (ref 0.00–1.49)
Prothrombin Time: 16.7 seconds — ABNORMAL HIGH (ref 11.6–15.2)

## 2014-09-12 LAB — URINE MICROSCOPIC-ADD ON

## 2014-09-12 LAB — URINALYSIS, ROUTINE W REFLEX MICROSCOPIC
Glucose, UA: NEGATIVE mg/dL
HGB URINE DIPSTICK: NEGATIVE
Ketones, ur: >80 mg/dL — AB
LEUKOCYTES UA: NEGATIVE
NITRITE: NEGATIVE
PROTEIN: 30 mg/dL — AB
SPECIFIC GRAVITY, URINE: 1.027 (ref 1.005–1.030)
Urobilinogen, UA: 0.2 mg/dL (ref 0.0–1.0)
pH: 5.5 (ref 5.0–8.0)

## 2014-09-12 LAB — ETHANOL: Alcohol, Ethyl (B): <11 mg/dL (ref 0–11)

## 2014-09-12 LAB — MRSA PCR SCREENING: MRSA by PCR: NEGATIVE

## 2014-09-12 LAB — TROPONIN I: TROPONIN I: 0.33 ng/mL — AB (ref <0.30)

## 2014-09-12 LAB — I-STAT CG4 LACTIC ACID, ED: LACTIC ACID, VENOUS: 13.78 mmol/L — AB (ref 0.5–2.2)

## 2014-09-12 LAB — APTT: APTT: 26 s (ref 24–37)

## 2014-09-12 MED ORDER — PREDNISONE 20 MG PO TABS: 10.0000 mg | ORAL_TABLET | Freq: Every day | ORAL | Status: DC

## 2014-09-12 MED ORDER — DILTIAZEM HCL 100 MG IV SOLR
5.0000 mg/h | Freq: Once | INTRAVENOUS | Status: AC
  Administered 2014-09-12: 5 mg/h via INTRAVENOUS
  Filled 2014-09-12: qty 100

## 2014-09-12 MED ORDER — METOPROLOL SUCCINATE ER 25 MG PO TB24: 25.0000 mg | ORAL_TABLET | Freq: Two times a day (BID) | ORAL | Status: DC

## 2014-09-12 MED ORDER — PIPERACILLIN-TAZOBACTAM 3.375 G IVPB
3.3750 g | Freq: Three times a day (TID) | INTRAVENOUS | Status: DC
  Administered 2014-09-12 – 2014-09-16 (×11): 3.375 g via INTRAVENOUS
  Filled 2014-09-12 (×11): qty 50

## 2014-09-12 MED ORDER — DILTIAZEM HCL 25 MG/5ML IV SOLN
10.0000 mg | Freq: Once | INTRAVENOUS | Status: AC
  Administered 2014-09-12: 10 mg via INTRAVENOUS
  Filled 2014-09-12: qty 5

## 2014-09-12 MED ORDER — LORAZEPAM 2 MG/ML IJ SOLN
1.0000 mg | INTRAMUSCULAR | Status: DC | PRN
  Administered 2014-09-14: 1 mg via INTRAVENOUS
  Filled 2014-09-12 (×2): qty 1

## 2014-09-12 MED ORDER — EZETIMIBE-SIMVASTATIN 10-40 MG PO TABS
1.0000 | ORAL_TABLET | Freq: Every day | ORAL | Status: DC
  Filled 2014-09-12: qty 1

## 2014-09-12 MED ORDER — FLUOXETINE HCL 20 MG PO CAPS
40.0000 mg | ORAL_CAPSULE | Freq: Every day | ORAL | Status: DC
  Filled 2014-09-12: qty 2

## 2014-09-12 MED ORDER — ACETAMINOPHEN 650 MG RE SUPP
650.0000 mg | Freq: Four times a day (QID) | RECTAL | Status: DC | PRN
Start: 2014-09-12 — End: 2014-09-20
  Administered 2014-09-12 – 2014-09-13 (×2): 650 mg via RECTAL
  Filled 2014-09-12 (×2): qty 1

## 2014-09-12 MED ORDER — SODIUM CHLORIDE 0.9 % IV BOLUS (SEPSIS)
500.0000 mL | Freq: Once | INTRAVENOUS | Status: AC
  Administered 2014-09-12: 500 mL via INTRAVENOUS

## 2014-09-12 MED ORDER — SODIUM CHLORIDE 0.9 % IV BOLUS (SEPSIS)
1000.0000 mL | INTRAVENOUS | Status: AC
  Administered 2014-09-12: 1000 mL via INTRAVENOUS

## 2014-09-12 MED ORDER — SODIUM CHLORIDE 0.9 % IJ SOLN
3.0000 mL | Freq: Two times a day (BID) | INTRAMUSCULAR | Status: DC
  Administered 2014-09-13 – 2014-09-19 (×9): 3 mL via INTRAVENOUS

## 2014-09-12 MED ORDER — PANTOPRAZOLE SODIUM 40 MG PO TBEC: 40.0000 mg | DELAYED_RELEASE_TABLET | Freq: Every day | ORAL | Status: DC

## 2014-09-12 MED ORDER — ACETAMINOPHEN 325 MG PO TABS: 650.0000 mg | ORAL_TABLET | Freq: Four times a day (QID) | ORAL | Status: DC | PRN

## 2014-09-12 MED ORDER — LORAZEPAM 2 MG/ML IJ SOLN
1.0000 mg | Freq: Once | INTRAMUSCULAR | Status: AC
  Administered 2014-09-12: 1 mg via INTRAVENOUS

## 2014-09-12 MED ORDER — GABAPENTIN 400 MG PO CAPS: 400.0000 mg | ORAL_CAPSULE | Freq: Two times a day (BID) | ORAL | Status: DC

## 2014-09-12 MED ORDER — TIOTROPIUM BROMIDE MONOHYDRATE 18 MCG IN CAPS
18.0000 ug | ORAL_CAPSULE | Freq: Every day | RESPIRATORY_TRACT | Status: DC | PRN
  Filled 2014-09-12: qty 5

## 2014-09-12 MED ORDER — DEXTROSE 5 % IV SOLN: 500.0000 mg | INTRAVENOUS | Status: DC

## 2014-09-12 MED ORDER — LEVETIRACETAM IN NACL 500 MG/100ML IV SOLN
500.0000 mg | Freq: Once | INTRAVENOUS | Status: AC
  Administered 2014-09-12: 500 mg via INTRAVENOUS
  Filled 2014-09-12: qty 100

## 2014-09-12 MED ORDER — ATORVASTATIN CALCIUM 20 MG PO TABS
20.0000 mg | ORAL_TABLET | Freq: Every day | ORAL | Status: DC
  Administered 2014-09-14 – 2014-09-19 (×6): 20 mg via ORAL
  Filled 2014-09-12 (×3): qty 1
  Filled 2014-09-12: qty 2
  Filled 2014-09-12: qty 1
  Filled 2014-09-12 (×2): qty 2

## 2014-09-12 MED ORDER — ONDANSETRON HCL 4 MG/2ML IJ SOLN
4.0000 mg | Freq: Four times a day (QID) | INTRAMUSCULAR | Status: DC | PRN
  Administered 2014-09-16: 4 mg via INTRAVENOUS
  Filled 2014-09-12: qty 2

## 2014-09-12 MED ORDER — NITROGLYCERIN 0.4 MG SL SUBL: 0.4000 mg | SUBLINGUAL_TABLET | SUBLINGUAL | Status: DC | PRN

## 2014-09-12 MED ORDER — SODIUM CHLORIDE 0.9 % IV SOLN
INTRAVENOUS | Status: DC
  Administered 2014-09-12 (×2): via INTRAVENOUS

## 2014-09-12 MED ORDER — LEVETIRACETAM IN NACL 500 MG/100ML IV SOLN
500.0000 mg | Freq: Two times a day (BID) | INTRAVENOUS | Status: DC
  Administered 2014-09-12 – 2014-09-13 (×3): 500 mg via INTRAVENOUS
  Filled 2014-09-12 (×5): qty 100

## 2014-09-12 MED ORDER — SODIUM CHLORIDE 0.9 % IV BOLUS (SEPSIS): 500.0000 mL | INTRAVENOUS | Status: AC

## 2014-09-12 MED ORDER — VANCOMYCIN HCL IN DEXTROSE 750-5 MG/150ML-% IV SOLN
750.0000 mg | Freq: Two times a day (BID) | INTRAVENOUS | Status: DC
  Administered 2014-09-12 – 2014-09-14 (×5): 750 mg via INTRAVENOUS
  Filled 2014-09-12 (×6): qty 150

## 2014-09-12 MED ORDER — AZITHROMYCIN 500 MG IV SOLR
500.0000 mg | Freq: Once | INTRAVENOUS | Status: AC
  Administered 2014-09-12: 500 mg via INTRAVENOUS
  Filled 2014-09-12: qty 500

## 2014-09-12 MED ORDER — INSULIN ASPART 100 UNIT/ML ~~LOC~~ SOLN
0.0000 [IU] | SUBCUTANEOUS | Status: DC
  Administered 2014-09-12: 2 [IU] via SUBCUTANEOUS
  Administered 2014-09-13 (×4): 1 [IU] via SUBCUTANEOUS
  Administered 2014-09-13 (×2): 2 [IU] via SUBCUTANEOUS
  Administered 2014-09-14 (×2): 1 [IU] via SUBCUTANEOUS
  Administered 2014-09-14: 2 [IU] via SUBCUTANEOUS
  Administered 2014-09-14 (×2): 1 [IU] via SUBCUTANEOUS
  Administered 2014-09-14: 2 [IU] via SUBCUTANEOUS
  Administered 2014-09-15: 1 [IU] via SUBCUTANEOUS
  Administered 2014-09-15: 2 [IU] via SUBCUTANEOUS
  Administered 2014-09-15: 3 [IU] via SUBCUTANEOUS
  Administered 2014-09-15: 9 [IU] via SUBCUTANEOUS

## 2014-09-12 MED ORDER — MAGNESIUM OXIDE 400 (241.3 MG) MG PO TABS: 400.0000 mg | ORAL_TABLET | Freq: Every day | ORAL | Status: DC

## 2014-09-12 MED ORDER — ALBUTEROL SULFATE (2.5 MG/3ML) 0.083% IN NEBU: 2.5000 mg | INHALATION_SOLUTION | RESPIRATORY_TRACT | Status: DC | PRN

## 2014-09-12 MED ORDER — DEXTROSE 5 % IV SOLN
1.0000 g | Freq: Once | INTRAVENOUS | Status: AC
  Administered 2014-09-12: 1 g via INTRAVENOUS
  Filled 2014-09-12: qty 10

## 2014-09-12 MED ORDER — LORAZEPAM 2 MG/ML IJ SOLN
INTRAMUSCULAR | Status: AC
  Administered 2014-09-12: 1 mg via INTRAVENOUS
  Filled 2014-09-12: qty 1

## 2014-09-12 MED ORDER — HEPARIN SODIUM (PORCINE) 5000 UNIT/ML IJ SOLN
5000.0000 [IU] | Freq: Three times a day (TID) | INTRAMUSCULAR | Status: DC
  Administered 2014-09-12 – 2014-09-15 (×8): 5000 [IU] via SUBCUTANEOUS
  Filled 2014-09-12 (×9): qty 1

## 2014-09-12 MED ORDER — NITROGLYCERIN 0.4 MG/SPRAY TL SOLN
1.0000 | Status: DC | PRN
  Filled 2014-09-12: qty 4.9

## 2014-09-12 MED ORDER — EZETIMIBE 10 MG PO TABS
10.0000 mg | ORAL_TABLET | Freq: Every day | ORAL | Status: DC
  Administered 2014-09-14 – 2014-09-19 (×6): 10 mg via ORAL
  Filled 2014-09-12 (×10): qty 1

## 2014-09-12 MED ORDER — ONDANSETRON HCL 4 MG PO TABS: 4.0000 mg | ORAL_TABLET | Freq: Four times a day (QID) | ORAL | Status: DC | PRN

## 2014-09-12 MED ORDER — DEXTROSE 5 % IV SOLN: 1.0000 g | INTRAVENOUS | Status: DC

## 2014-09-12 MED ORDER — DILTIAZEM HCL 100 MG IV SOLR
5.0000 mg/h | INTRAVENOUS | Status: DC
  Administered 2014-09-12: 5 mg/h via INTRAVENOUS
  Administered 2014-09-13: 15 mg/h via INTRAVENOUS
  Administered 2014-09-13: 12.5 mg/h via INTRAVENOUS
  Administered 2014-09-14: 10 mg/h via INTRAVENOUS
  Filled 2014-09-12 (×2): qty 100

## 2014-09-12 MED ORDER — PIPERACILLIN-TAZOBACTAM 3.375 G IVPB
3.3750 g | Freq: Once | INTRAVENOUS | Status: AC
  Administered 2014-09-12: 3.375 g via INTRAVENOUS
  Filled 2014-09-12: qty 50

## 2014-09-12 NOTE — Progress Notes (Signed)
ANTIBIOTIC CONSULT NOTE - INITIAL  Pharmacy Consult for Vancomycin/Zosyn Indication: pneumonia  No Known Allergies  Patient Measurements: Height: 5\' 9"  (175.3 cm) (FAMILY) Weight: 220 lb (99.791 kg) (FAMILY) IBW/kg (Calculated) : 70.7   Vital Signs: Temp: 99.6 F (37.6 C) (12/13 0821) Temp Source: Oral (12/13 0910) BP: 103/62 mmHg (12/13 1130) Pulse Rate: 130 (12/13 1130) Intake/Output from previous day:   Intake/Output from this shift: Total I/O In: 1200 [I.V.:1200] Out: -   Labs:  Recent Labs  09/12/14 0800 09/12/14 0822  WBC 16.5*  --   HGB 11.4* 13.6  PLT 171  --   CREATININE 1.26 1.20   Estimated Creatinine Clearance: 53.3 mL/min (by C-G formula based on Cr of 1.2). No results for input(s): VANCOTROUGH, VANCOPEAK, VANCORANDOM, GENTTROUGH, GENTPEAK, GENTRANDOM, TOBRATROUGH, TOBRAPEAK, TOBRARND, AMIKACINPEAK, AMIKACINTROU, AMIKACIN in the last 72 hours.   Microbiology: Recent Results (from the past 720 hour(s))  Body fluid culture     Status: None   Collection Time: 08/16/14 10:40 PM  Result Value Ref Range Status   Specimen Description KNEE RIGHT  Final   Special Requests Normal  Final   Gram Stain   Final    ABUNDANT WBC PRESENT, PREDOMINANTLY PMN NO ORGANISMS SEEN Performed at [facility]    Culture   Final    NO GROWTH 3 DAYS Performed at [facility]    Report Status 08/20/2014 FINAL  Final    Medical History: Past Medical History  Diagnosis Date  . Hypertension   . Diabetes mellitus   . COPD (chronic obstructive pulmonary disease)   . Dementia   . Atrial fibrillation     Medications:  Scheduled:  . levETIRAcetam  500 mg Intravenous Q12H   Infusions:  . sodium chloride 999 mL/hr at 09/12/14 1055  . [START ON 09/13/2014] azithromycin    . [START ON 09/13/2014] cefTRIAXone (ROCEPHIN)  IV    . sodium chloride 1,000 mL (09/12/14 1104)   Followed by  . sodium chloride     PRN:  Assessment: 80 yo male presenting to  ER with altered mental status and h/o of UTI. Pt has had 2 seizures while in the emergency room. Pt received Rocephin and Azithromycin in ER and now to start on Zosyn and Vancomycin for aspiration PNA.  Goal of Therapy:  Vancomycin trough level 15-20 mcg/ml  Plan:  . Will start on Zosyn 3.375 Gm IV Q8h (4 hour infusion) and Vancomycin 750mg  IV Q12h . Will f/u Scr and Cx . Will check Vanco trough level at steady state or if renal function changes  Jamayah Myszka, Chales AbrahamsMary Ann, PharmD 09/12/2014,12:19 PM

## 2014-09-12 NOTE — ED Notes (Signed)
Tried to call report.  Charge nurse said to call back.

## 2014-09-12 NOTE — ED Notes (Signed)
MD Elgergway is aware of runs of V tach. X 2 runs. 5 beats of V tach. No new orders.

## 2014-09-12 NOTE — ED Notes (Signed)
MD at bedside. 

## 2014-09-12 NOTE — Progress Notes (Signed)
Pharmacy Brief Note  FDA warning states to limit simvastatin to 10mg /day when used with diltiazem.  Patients on diltiazem and simvastatin >10 mg/day have reported cases of rhabdomyolysis.   Atorvastatin (Lipitor) 20 mg was substituted for simvastatin 40 mg per P&T policy.    Please consider at discharge.  Thanks, Pharmacy.  Lynann Beaverhristine Phillip Sandler PharmD, BCPS Pager [phone] 09/12/2014 3:39 PM

## 2014-09-12 NOTE — ED Notes (Signed)
Transported to floor.

## 2014-09-12 NOTE — ED Notes (Signed)
Family at bedside. 

## 2014-09-12 NOTE — H&P (Signed)
Patient Demographics  Glen Miller, is a 80 y.o. male  MRN: [id number]   DOB - 04/01/1928  Admit Date - 09/12/2014  Outpatient Primary MD for the patient is Georgann HousekeeperHUSAIN,KARRAR, MD   With History of -  Past Medical History  Diagnosis Date  . Hypertension   . Diabetes mellitus   . COPD (chronic obstructive pulmonary disease)   . Dementia   . Atrial fibrillation       Past Surgical History  Procedure Laterality Date  . Kidney stones    . Back surgery    . Exploration post operative open heart    . Triple bypass    . Cholecystectomy    . Knee arthroscopy    . Appendectomy    . Eye surgery    . Joint replacement      in for   Chief Complaint  Patient presents with  . Altered Mental Status     HPI  Glen Miller  is a 80 y.o. male, with past medical history of hypertension, diabetes mellitus, COPD, and atrial fibrillation not on anticoagulation, dementia, baseline bedridden/wheelchair dependent, verbal, brought by his daughters for altered mental status, family report patient has been more confused, lethargic, less responsive over the last 24 hours, which prompted them to bring him to ED, NAD patient was noticed to have seizures, given IV Ativan, and loading dose of IV Keppra 1000 mg, seen by neurology service, with recommendation of MRI brain, continue Keppra, EEG, and workup was significant for pneumonia, most likely aspiration as family report coughing during eating, chest x-ray showing evidence of infiltrate/opacity, as well patient was noticed to have hypernatremia, he has known history of A. fib, was in A. fib with RVR requiring Cardizem drip.    Review of Systems    Lethargic, nonverbal, can't provide  review of system.  Social History History  Substance Use Topics  . Smoking status: Former Games developermoker  . Smokeless tobacco: Not on file  . Alcohol Use: No    Family History No family history on file.   Prior to Admission medications   Medication Sig Start  Date End Date Taking? Authorizing Provider  acetaminophen (TYLENOL) 500 MG tablet Take 1,000 mg by mouth every 6 (six) hours as needed. For pain   Yes Historical Provider, MD  Calcium Carbonate-Vitamin D (CALCIUM 600/VITAMIN D) 600-400 MG-UNIT per tablet Take 1 tablet by mouth 2 (two) times daily.   Yes Historical Provider, MD  celecoxib (CELEBREX) 200 MG capsule Take 200 mg by mouth daily.   Yes Historical Provider, MD  cetirizine (ZYRTEC) 10 MG tablet Take 10 mg by mouth at bedtime.   Yes Historical Provider, MD  clobetasol cream (TEMOVATE) 0.05 % Apply 1 application topically 2 (two) times daily as needed (skin outbreak).  11/18/13  Yes Historical Provider, MD  econazole nitrate 1 % cream Apply topically daily. Apply to affected area twice daily. Patient taking differently: Apply 1 application topically daily as needed (skin outbreak). Apply to affected area twice daily. 05/11/14  Yes Max T Hyatt, DPM  ezetimibe-simvastatin (VYTORIN) 10-40 MG per tablet Take 1 tablet by mouth daily.   Yes Historical Provider, MD  FLUoxetine (PROZAC) 20 MG capsule Take 40 mg by mouth daily.   Yes Historical Provider, MD  furosemide (LASIX) 40 MG tablet Take 40 mg by mouth daily.   Yes Historical Provider, MD  gabapentin (NEURONTIN) 400 MG capsule Take 400 mg by mouth 2 (two) times daily.   Yes Historical Provider, MD  hydrOXYzine (ATARAX/VISTARIL) 25 MG tablet Take 25 mg by mouth 2 (two) times daily as needed for itching (itching).  11/18/13  Yes Historical Provider, MD  insulin glargine (LANTUS) 100 UNIT/ML injection Inject 30 Units into the skin at bedtime.   Yes Historical Provider, MD  isosorbide mononitrate (IMDUR) 60 MG 24 hr tablet Take 60 mg by mouth daily.   Yes Historical Provider, MD  losartan (COZAAR) 50 MG tablet Take 50 mg by mouth daily.   Yes Historical Provider, MD  magnesium oxide (MAG-OX) 400 MG tablet Take 400 mg by mouth daily.   Yes Historical Provider, MD  memantine (NAMENDA) 10 MG tablet Take  10 mg by mouth 2 (two) times daily.   Yes Historical Provider, MD  metFORMIN (GLUCOPHAGE) 500 MG tablet Take 500 mg by mouth 2 (two) times daily with a meal.   Yes Historical Provider, MD  metoprolol succinate (TOPROL-XL) 25 MG 24 hr tablet Take 25 mg by mouth 2 (two) times daily.   Yes Historical Provider, MD  nitroGLYCERIN (NITROLINGUAL) 0.4 MG/SPRAY spray Place 1 spray under the tongue every 5 (five) minutes as needed. For chest pain   Yes Historical Provider, MD  omeprazole (PRILOSEC) 20 MG capsule Take 20 mg by mouth daily.   Yes Historical Provider, MD  oxyCODONE-acetaminophen (PERCOCET/ROXICET) 5-325 MG per tablet Take 1 tablet by mouth every 4 (four) hours as needed for severe pain. 08/17/14  Yes Mirian Mo, MD  potassium chloride SA (K-DUR,KLOR-CON) 20 MEQ tablet Take 20 mEq by mouth 2 (two) times daily.   Yes Historical Provider, MD  predniSONE (DELTASONE) 10 MG tablet Take 10 mg by mouth daily with breakfast.  09/01/13  Yes Historical Provider, MD  promethazine (PHENERGAN) 25 MG tablet Take 25 mg by mouth every 6 (six) hours as needed for nausea.  09/11/14  Yes Historical Provider, MD  SPIRIVA HANDIHALER 18 MCG inhalation capsule Place 18 mcg into inhaler and inhale daily as needed (copd).  09/01/13  Yes Historical Provider, MD  Tamsulosin HCl (FLOMAX) 0.4 MG CAPS Take 0.4 mg by mouth every evening.   Yes Historical Provider, MD  traMADol (ULTRAM) 50 MG tablet Take 1 tablet (50 mg total) by mouth every 6 (six) hours as needed. 09/29/13  Yes Richard Leeanne Deed, DPM  triamcinolone cream (KENALOG) 0.1 % Apply 1 application topically 2 (two) times daily as needed (skin outbreak).  12/03/13  Yes Historical Provider, MD  ZONALON 5 % cream Apply 1 application topically daily as needed for itching (back).  12/01/13  Yes Historical Provider, MD    No Known Allergies  Physical Exam  Vitals  Blood pressure 103/62, pulse 130, temperature 99.6 F (37.6 C), temperature source Rectal, resp. rate 33,  height 5\' 9"  (1.753 m), weight 99.791 kg (220 lb), SpO2 97 %.   1. General ill-appearing elderly male lying in bed .  2. Patient is nonverbal, lethargic, moaning to painful stimuli.  3. Could not evaluate appropriate radiological exam giving his mental status.  4. Try Oral Mucosa.  5. Supple Neck, No JVD, No cervical lymphadenopathy appriciated, No Carotid Bruits.  6. Symmetrical Chest wall movement, fair air movement bilaterally, no wheezing.  7. Irregular irregular, tachycardic, No Gallops, Rubs or Murmurs, No Parasternal Heave.  8. Positive Bowel Sounds, Abdomen Soft, No tenderness, No organomegaly appriciated,No rebound -guarding or rigidity.  9.  No Cyanosis, Normal Skin Turgor, No Skin Rash or Bruise.   Data Review  CBC  Recent Labs Lab 09/12/14 0800 09/12/14 0822  WBC 16.5*  --  HGB 11.4* 13.6  HCT 39.0 40.0  PLT 171  --   MCV 89.7  --   MCH 26.2  --   MCHC 29.2*  --   RDW 17.8*  --   LYMPHSABS 1.0  --   MONOABS 0.9  --   EOSABS 0.0  --   BASOSABS 0.0  --    ------------------------------------------------------------------------------------------------------------------  Chemistries   Recent Labs Lab 09/12/14 0800 09/12/14 0822  NA 150* 147  K 3.8 3.6*  CL 95* 98  CO2 24  --   GLUCOSE 214* 210*  BUN 19 21  CREATININE 1.26 1.20  CALCIUM 8.1*  --   AST 27  --   ALT 9  --   ALKPHOS 60  --   BILITOT 0.7  --    ------------------------------------------------------------------------------------------------------------------ estimated creatinine clearance is 53.3 mL/min (by C-G formula based on Cr of 1.2). ------------------------------------------------------------------------------------------------------------------ No results for input(s): TSH, T4TOTAL, T3FREE, THYROIDAB in the last 72 hours.  Invalid input(s): FREET3   Coagulation profile  Recent Labs Lab 09/12/14 0800  INR 1.34    ------------------------------------------------------------------------------------------------------------------- No results for input(s): DDIMER in the last 72 hours. -------------------------------------------------------------------------------------------------------------------  Cardiac Enzymes  Recent Labs Lab 09/12/14 0800  TROPONINI 0.33*   ------------------------------------------------------------------------------------------------------------------ Invalid input(s): POCBNP   ---------------------------------------------------------------------------------------------------------------  Urinalysis    Component Value Date/Time   COLORURINE AMBER* 09/12/2014 0859   APPEARANCEUR CLEAR 09/12/2014 0859   LABSPEC 1.027 09/12/2014 0859   PHURINE 5.5 09/12/2014 0859   GLUCOSEU NEGATIVE 09/12/2014 0859   HGBUR NEGATIVE 09/12/2014 0859   BILIRUBINUR SMALL* 09/12/2014 0859   KETONESUR >80* 09/12/2014 0859   PROTEINUR 30* 09/12/2014 0859   UROBILINOGEN 0.2 09/12/2014 0859   NITRITE NEGATIVE 09/12/2014 0859   LEUKOCYTESUR NEGATIVE 09/12/2014 0859    ----------------------------------------------------------------------------------------------------------------  Imaging results:   Ct Head Wo Contrast  09/12/2014   CLINICAL DATA:  behavioral changes x 1 day.Nurse note: EMS reports pt lives at home, increased confusion since yesterday, history of dementia, increased confusion which often happens with his frequent UTI"s . Decreased sats high 70's  EXAM: CT HEAD WITHOUT CONTRAST  TECHNIQUE: Contiguous axial images were obtained from the base of the skull through the vertex without intravenous contrast.  COMPARISON:  12/03/2011  FINDINGS: Atherosclerotic and physiologic intracranial calcifications. Diffuse parenchymal atrophy. Patchy areas of hypoattenuation in deep and periventricular white matter bilaterally. Negative for acute intracranial hemorrhage, mass lesion, acute  infarction, midline shift, or mass-effect. Acute infarct may be inapparent on noncontrast CT. Ventricles and sulci symmetric. Bone windows demonstrate no focal lesion.  IMPRESSION: 1. Negative for bleed or other acute intracranial process. 2. Stable atrophy and nonspecific white matter changes.   Electronically Signed   By: Oley Balm M.D.   On: 09/12/2014 08:41   Dg Chest Portable 1 View  09/12/2014   CLINICAL DATA:  SOb tody; dementia; hx HTN; ex smoker; diabetic  EXAM: PORTABLE CHEST - 1 VIEW  COMPARISON:  12/03/2011  FINDINGS: Dilated aortic arch and descending thoracic aorta. Previous median sternotomy. Stable cardiomegaly.  Patchy airspace opacities in the lingula or left lower lung, new since previous exam. No effusion.  IMPRESSION: 1. New left lower lung airspace disease suggesting pneumonia. 2. Thoracic aortic aneurysm. Consider thoracic MRA (lower radiation risk, can be performed noncontrast in the setting of renal dysfunction) or CTA ( higher spatial resolution) for further characterization.   Electronically Signed   By: Oley Balm M.D.   On: 09/12/2014 08:45        Assessment & Plan  Active Problems:   Generalized seizures   Dementia   Chronic atrial fibrillation   Acute encephalopathy   PNA (pneumonia)   Hypernatremia    Seizures: - neurology is unclear, and urology consult appreciated, will obtain MRI brain to rule out CVA, will obtain EEG, will continue with Keppra 500 mg IV twice a day, seizure precautions. - Possibly multifactorial related to hypernatremia, infection.  Acute encephalopathy -Secondary to hypernatremia, infectious process, and seizures, CT head does not show any acute findings, will obtain MRI of the brain. -Poor baseline mental status secondary to dementia.  Atrial fibrillation with RVR -Patient will be started on IV Cardizem drip, admit to stepdown.  Hypernatremia -Volume depletion and dehydration, continue with aggressive IV normal  saline, when patient is euvolemic can change to half-normal saline.  Pneumonia -Likely aspiration pneumonia, consult SLP, start on vancomycin and Zosyn  DVT Prophylaxis Heparin  AM Labs Ordered, also please review Full Orders  Family Communication: Admission, patients condition and plan of care including tests being ordered have been discussed with the daughters at bedside who indicate understanding and agree with the plan and Code Status.  Code Status DNR  Admit to stepdown  Condition critical  Time spent in minutes : 70 minutes    Scotti Kosta M.D on 09/12/2014 at 12:15 PM  Between 7am to 7pm - Pager - [phone]  After 7pm go to [email or web address] - password [email or web address]  And look for the night coverage person covering me after hours  [facility] Office  [phone]   **Disclaimer: This note may have been dictated with voice recognition software. Similar sounding words can inadvertently be transcribed and this note may contain transcription errors which may not have been corrected upon publication of note.**

## 2014-09-12 NOTE — ED Provider Notes (Signed)
CSN: [id number]     Arrival date & time 09/12/14  0740 History   First MD Initiated Contact with Patient 09/12/14 [phone]     Chief Complaint  Patient presents with  . Altered Mental Status   Level V caveat: Altered mental status HPI Patient presents to the emergency room with altered mental status. The history is limited in that the patient cannot speak to me currently. According to EMS, the patient lives at home with his wife. Wife noticed increasing confusion since yesterday. At baseline the patient has dementia and some degree of confusion.  The patient has had UTIs in the past and the wife was concerned that this may be occurring. On EMS arrival the patient's oxygen saturation was decreased into the 70s. Supplemental oxygen was provided.  Oxygen saturation is now in the 90s.  Shortly after his arrival in the emergency department the patient began having a seizure. It resolved within 5-10 seconds after my arrival at the bedside. The patient remains noncommunicative. Past Medical History  Diagnosis Date  . Hypertension   . Diabetes mellitus   . COPD (chronic obstructive pulmonary disease)   . Dementia   . Atrial fibrillation    Past Surgical History  Procedure Laterality Date  . Kidney stones    . Back surgery    . Exploration post operative open heart    . Triple bypass    . Cholecystectomy    . Knee arthroscopy    . Appendectomy    . Eye surgery    . Joint replacement     No family history on file. History  Substance Use Topics  . Smoking status: Former Games developermoker  . Smokeless tobacco: Not on file  . Alcohol Use: No    Review of Systems  Unable to perform ROS: Patient unresponsive      Allergies  Review of patient's allergies indicates no known allergies.  Home Medications   Prior to Admission medications   Medication Sig Start Date End Date Taking? Authorizing Provider  acetaminophen (TYLENOL) 500 MG tablet Take 1,000 mg by mouth every 6 (six) hours as needed. For  pain   Yes Historical Provider, MD  Calcium Carbonate-Vitamin D (CALCIUM 600/VITAMIN D) 600-400 MG-UNIT per tablet Take 1 tablet by mouth 2 (two) times daily.   Yes Historical Provider, MD  celecoxib (CELEBREX) 200 MG capsule Take 200 mg by mouth daily.   Yes Historical Provider, MD  cetirizine (ZYRTEC) 10 MG tablet Take 10 mg by mouth at bedtime.   Yes Historical Provider, MD  clobetasol cream (TEMOVATE) 0.05 % Apply 1 application topically 2 (two) times daily as needed (skin outbreak).  11/18/13  Yes Historical Provider, MD  econazole nitrate 1 % cream Apply topically daily. Apply to affected area twice daily. Patient taking differently: Apply 1 application topically daily as needed (skin outbreak). Apply to affected area twice daily. 05/11/14  Yes Max T Hyatt, DPM  ezetimibe-simvastatin (VYTORIN) 10-40 MG per tablet Take 1 tablet by mouth daily.   Yes Historical Provider, MD  FLUoxetine (PROZAC) 20 MG capsule Take 40 mg by mouth daily.   Yes Historical Provider, MD  furosemide (LASIX) 40 MG tablet Take 40 mg by mouth daily.   Yes Historical Provider, MD  gabapentin (NEURONTIN) 400 MG capsule Take 400 mg by mouth 2 (two) times daily.   Yes Historical Provider, MD  hydrOXYzine (ATARAX/VISTARIL) 25 MG tablet Take 25 mg by mouth 2 (two) times daily as needed for itching (itching).  11/18/13  Yes  Historical Provider, MD  insulin glargine (LANTUS) 100 UNIT/ML injection Inject 30 Units into the skin at bedtime.   Yes Historical Provider, MD  isosorbide mononitrate (IMDUR) 60 MG 24 hr tablet Take 60 mg by mouth daily.   Yes Historical Provider, MD  losartan (COZAAR) 50 MG tablet Take 50 mg by mouth daily.   Yes Historical Provider, MD  magnesium oxide (MAG-OX) 400 MG tablet Take 400 mg by mouth daily.   Yes Historical Provider, MD  memantine (NAMENDA) 10 MG tablet Take 10 mg by mouth 2 (two) times daily.   Yes Historical Provider, MD  metFORMIN (GLUCOPHAGE) 500 MG tablet Take 500 mg by mouth 2 (two) times  daily with a meal.   Yes Historical Provider, MD  metoprolol succinate (TOPROL-XL) 25 MG 24 hr tablet Take 25 mg by mouth 2 (two) times daily.   Yes Historical Provider, MD  nitroGLYCERIN (NITROLINGUAL) 0.4 MG/SPRAY spray Place 1 spray under the tongue every 5 (five) minutes as needed. For chest pain   Yes Historical Provider, MD  omeprazole (PRILOSEC) 20 MG capsule Take 20 mg by mouth daily.   Yes Historical Provider, MD  oxyCODONE-acetaminophen (PERCOCET/ROXICET) 5-325 MG per tablet Take 1 tablet by mouth every 4 (four) hours as needed for severe pain. 08/17/14  Yes Mirian MoMatthew Gentry, MD  potassium chloride SA (K-DUR,KLOR-CON) 20 MEQ tablet Take 20 mEq by mouth 2 (two) times daily.   Yes Historical Provider, MD  predniSONE (DELTASONE) 10 MG tablet Take 10 mg by mouth daily with breakfast.  09/01/13  Yes Historical Provider, MD  promethazine (PHENERGAN) 25 MG tablet Take 25 mg by mouth every 6 (six) hours as needed for nausea.  09/11/14  Yes Historical Provider, MD  SPIRIVA HANDIHALER 18 MCG inhalation capsule Place 18 mcg into inhaler and inhale daily as needed (copd).  09/01/13  Yes Historical Provider, MD  Tamsulosin HCl (FLOMAX) 0.4 MG CAPS Take 0.4 mg by mouth every evening.   Yes Historical Provider, MD  traMADol (ULTRAM) 50 MG tablet Take 1 tablet (50 mg total) by mouth every 6 (six) hours as needed. 09/29/13  Yes Richard Leeanne Deeduchman, DPM  triamcinolone cream (KENALOG) 0.1 % Apply 1 application topically 2 (two) times daily as needed (skin outbreak).  12/03/13  Yes Historical Provider, MD  ZONALON 5 % cream Apply 1 application topically daily as needed for itching (back).  12/01/13  Yes Historical Provider, MD   BP 110/82 mmHg  Pulse 152  Temp(Src) 99.6 F (37.6 C) (Rectal)  Resp 28  SpO2 93% Physical Exam  Constitutional: No distress.  HENT:  Head: Normocephalic and atraumatic.  Right Ear: External ear normal.  Left Ear: External ear normal.  Mouth/Throat: No oropharyngeal exudate.  Eyes:  Conjunctivae are normal. Right eye exhibits no discharge. Left eye exhibits no discharge. No scleral icterus.  Neck: Neck supple. No JVD present. No tracheal deviation present.  Cardiovascular: Regular rhythm.  Tachycardia present.  Exam reveals no friction rub.   No murmur heard. Pulmonary/Chest: No stridor. No respiratory distress. He has no wheezes. He has no rales.  Sonorous respirations  Abdominal: Soft. He exhibits no distension and no mass. There is no tenderness. There is no rebound and no guarding.  Genitourinary:  Normal external GU, poor hygiene  Musculoskeletal:  Edema bilateral ankles  Neurological: He is unresponsive. Cranial nerve deficit: no gross deficits.  No obvious facial droop, patient is not following commands currently. Seizure activity was witnessed at the bedside consisting of facial twitching and grimacing rhythmically associated  with tonic extension of the extremities  Skin: Skin is warm and dry. No rash noted. He is not diaphoretic.  Nursing note and vitals reviewed.   ED Course  Procedures (including critical care time) CRITICAL CARE Performed by: ZOXWR,UEA Total critical care time: 40 Critical care time was exclusive of separately billable procedures and treating other patients. Critical care was necessary to treat or prevent imminent or life-threatening deterioration. Critical care was time spent personally by me on the following activities: development of treatment plan with patient and/or surrogate as well as nursing, discussions with consultants, evaluation of patient's response to treatment, examination of patient, obtaining history from patient or surrogate, ordering and performing treatments and interventions, ordering and review of laboratory studies, ordering and review of radiographic studies, pulse oximetry and re-evaluation of patient's condition.  Labs Review Labs Reviewed  PROTIME-INR - Abnormal; Notable for the following:    Prothrombin Time  16.7 (*)    All other components within normal limits  CBC - Abnormal; Notable for the following:    WBC 16.5 (*)    Hemoglobin 11.4 (*)    MCHC 29.2 (*)    RDW 17.8 (*)    All other components within normal limits  DIFFERENTIAL - Abnormal; Notable for the following:    Neutrophils Relative % 89 (*)    Neutro Abs 14.6 (*)    Lymphocytes Relative 6 (*)    All other components within normal limits  COMPREHENSIVE METABOLIC PANEL - Abnormal; Notable for the following:    Sodium 150 (*)    Chloride 95 (*)    Glucose, Bld 214 (*)    Calcium 8.1 (*)    Albumin 3.0 (*)    GFR calc non Af Amer 51 (*)    GFR calc Af Amer 59 (*)    Anion gap 31 (*)    All other components within normal limits  URINALYSIS, ROUTINE W REFLEX MICROSCOPIC - Abnormal; Notable for the following:    Color, Urine AMBER (*)    Bilirubin Urine SMALL (*)    Ketones, ur >80 (*)    Protein, ur 30 (*)    All other components within normal limits  TROPONIN I - Abnormal; Notable for the following:    Troponin I 0.33 (*)    All other components within normal limits  I-STAT CHEM 8, ED - Abnormal; Notable for the following:    Potassium 3.6 (*)    Glucose, Bld 210 (*)    Calcium, Ion 0.92 (*)    All other components within normal limits  I-STAT CG4 LACTIC ACID, ED - Abnormal; Notable for the following:    Lactic Acid, Venous 13.78 (*)    All other components within normal limits  CULTURE, BLOOD (ROUTINE X 2)  CULTURE, BLOOD (ROUTINE X 2)  URINE CULTURE  ETHANOL  APTT  URINE RAPID DRUG SCREEN (HOSP PERFORMED)  BLOOD GAS, ARTERIAL  URINE MICROSCOPIC-ADD ON    Imaging Review Ct Head Wo Contrast  09/12/2014   CLINICAL DATA:  behavioral changes x 1 day.Nurse note: EMS reports pt lives at home, increased confusion since yesterday, history of dementia, increased confusion which often happens with his frequent UTI"s . Decreased sats high 70's  EXAM: CT HEAD WITHOUT CONTRAST  TECHNIQUE: Contiguous axial images were  obtained from the base of the skull through the vertex without intravenous contrast.  COMPARISON:  12/03/2011  FINDINGS: Atherosclerotic and physiologic intracranial calcifications. Diffuse parenchymal atrophy. Patchy areas of hypoattenuation in deep and periventricular white matter  bilaterally. Negative for acute intracranial hemorrhage, mass lesion, acute infarction, midline shift, or mass-effect. Acute infarct may be inapparent on noncontrast CT. Ventricles and sulci symmetric. Bone windows demonstrate no focal lesion.  IMPRESSION: 1. Negative for bleed or other acute intracranial process. 2. Stable atrophy and nonspecific white matter changes.   Electronically Signed   By: Oley Balm M.D.   On: 09/12/2014 08:41   Dg Chest Portable 1 View  09/12/2014   CLINICAL DATA:  SOb tody; dementia; hx HTN; ex smoker; diabetic  EXAM: PORTABLE CHEST - 1 VIEW  COMPARISON:  12/03/2011  FINDINGS: Dilated aortic arch and descending thoracic aorta. Previous median sternotomy. Stable cardiomegaly.  Patchy airspace opacities in the lingula or left lower lung, new since previous exam. No effusion.  IMPRESSION: 1. New left lower lung airspace disease suggesting pneumonia. 2. Thoracic aortic aneurysm. Consider thoracic MRA (lower radiation risk, can be performed noncontrast in the setting of renal dysfunction) or CTA ( higher spatial resolution) for further characterization.   Electronically Signed   By: Oley Balm M.D.   On: 09/12/2014 08:45     EKG Interpretation   Date/Time:  Sunday September 12 2014 08:14:31 EST Ventricular Rate:  192 PR Interval:  57 QRS Duration: 107 QT Interval:  257 QTC Calculation: 459 R Axis:   71 Text Interpretation:  undetermined rhythm Repolarization abnormality, prob  rate related Since last tracing rate faster Confirmed by Somara Frymire  MD-J, Tina Gruner  ([id number]) on 09/12/2014 8:48:10 AM     08 49  Heart rate is in the 130s at the bedside.  Pt is tremulous.   He is awake now an answering  questions.  Will give fluid bolus, cardizem and repeat EKG.  Doubt SVT.  ? Sinus tach vs a fib 0922  I walked to the bedside.  Pt is having another generalized seizure.  Started with head and eyes deviating to the right.  Became generalized.  Will consult neuro.  Give dose of ativan.  MDM   Final diagnoses:  Atrial fibrillation with rapid ventricular response  Seizure  Hypernatremia  CAP (community acquired pneumonia)    Patient presents to emergency room with altered mental status but has several issues going on at this point.  He has had 2 seizures in the emergency department. I consulted with neurology, Dr Roseanne Reno. The patient has been started on Keppra. CT scan is not showing any acute abnormalities but the etiology of the seizures is unclear at this point. The patient will require further evaluation.  Patient went into a rapid rhythm. EKG suggesting atrial fibrillation. Patient does have a history. IV Cardizem has been started to control his rate.  Patient does have an elevated lactic acid level. He is afebrile chest x-ray suggests the possibility of pneumonia. He could have early sepsis syndromes although the lactic acid level very well may be related to his seizure.  IV antibiotics have been ordered. We'll continue with fluid hydration and monitor closely.  The patient will require admission to the hospital. I will consult the medical service.    Linwood Dibbles, MD 09/12/14 [phone]

## 2014-09-12 NOTE — ED Notes (Addendum)
EMS reports pt lives at home, increased confusion since yesterday, history of dementia, increased confusion which often happens with his frequent UTI"s . Decreased sats high 70's

## 2014-09-12 NOTE — ED Notes (Addendum)
I stat Latic acid result given to RN Shawn StallAshley Lassiter and Dr Roselyn BeringJ. Knapp

## 2014-09-12 NOTE — ED Notes (Signed)
Patient transported to CT 

## 2014-09-12 NOTE — Progress Notes (Signed)
No urine documented in epic Bladder scan done showed 138 ml. Report to night RN to monitor and notify MD as needed.

## 2014-09-12 NOTE — ED Notes (Signed)
RRT PRESENT TO OBTAIN SAMPLE

## 2014-09-12 NOTE — ED Notes (Signed)
Bed: [id number] Expected date: 09/12/14 Expected time: 7:34 AM Means of arrival: Ambulance Comments: AMS history of Dementia and UTI's

## 2014-09-12 NOTE — ED Notes (Signed)
MD Elgergaway at bedside from [facility]

## 2014-09-12 NOTE — ED Notes (Signed)
Increased Cardizem 10 mg/hr due to good BP 120/71 and orders HR still at 133

## 2014-09-12 NOTE — Consult Note (Signed)
Reason for Consult: New-onset generalized seizure activity.  HPI:                                                                                                                                          Glen Miller is an 80 y.o. male history of hypertension, diabetes mellitus, COPD, atrial fibrillation not on anticoagulation, and dementia brought to the emergency room for evaluation of increasing alteration in mental status. Shortly after arriving in the emergency room he had a generalized seizure. Patient also had a witnessed subsequent generalized seizure, and partial seizure activity during this evaluation. He was given IV Ativan and also charted on a loading dose of Keppra 1000 mg IV. He was given an additional 1 mg of Ativan for partial seizure activity prior to completion of IV Keppra load. No further seizure activity was seen. There's no previous history of seizures. Family indicated that he had become progressively more confused over several days. He has a history of urinary tract infections with increased confusion. Urinalysis today showed only of a small number of leukocytes. Microscopic exam was normal. Rectal temperature was 99.6. WBC count was 16.5 with 89% neutrophils. CT scan of his head showed no acute intracranial abnormality.  Past Medical History  Diagnosis Date  . Hypertension   . Diabetes mellitus   . COPD (chronic obstructive pulmonary disease)   . Dementia   . Atrial fibrillation     Past Surgical History  Procedure Laterality Date  . Kidney stones    . Back surgery    . Exploration post operative open heart    . Triple bypass    . Cholecystectomy    . Knee arthroscopy    . Appendectomy    . Eye surgery    . Joint replacement      Family history: Positive for coronary artery disease; otherwise unremarkable.  Social History:  reports that he has quit smoking. He does not have any smokeless tobacco history on file. He reports that he does not drink alcohol. His  drug history is not on file.  No Known Allergies  MEDICATIONS:                                                                                                                     I have reviewed the patient's current medications.   ROS:  History obtained from spouse and daughter  General ROS: negative for - chills, fatigue, fever, night sweats, weight gain or weight loss Psychological ROS: As noted in present illness Ophthalmic ROS: negative for - blurry vision, double vision, eye pain or loss of vision ENT ROS: negative for - epistaxis, nasal discharge, oral lesions, sore throat, tinnitus or vertigo Allergy and Immunology ROS: negative for - hives or itchy/watery eyes Hematological and Lymphatic ROS: negative for - bleeding problems, bruising or swollen lymph nodes Endocrine ROS: negative for - galactorrhea, hair pattern changes, polydipsia/polyuria or temperature intolerance Respiratory ROS: negative for - cough, hemoptysis, shortness of breath or wheezing Cardiovascular ROS: negative for - chest pain, dyspnea on exertion, edema or irregular heartbeat Gastrointestinal ROS: negative for - abdominal pain, diarrhea, hematemesis, nausea/vomiting or stool incontinence Genito-Urinary ROS: negative for - dysuria, hematuria, incontinence or urinary frequency/urgency Musculoskeletal ROS: Chronically wheelchair-bound Neurological ROS: as noted in HPI Dermatological ROS: negative for rash and skin lesion changes   Blood pressure 118/69, pulse 156, temperature 99.6 F (37.6 C), temperature source Rectal, resp. rate 35, height 5' 9" (1.753 m), weight 99.791 kg (220 lb), SpO2 91 %. Appearance was that of a moderately obese elderly man who was obtunded and responsive only to noxious stimuli. HEENT unremarkable. Neck was supple with full range of motion  manually.  Neurologic Examination:                                                                                                      Patient was markedly obtunded. He responded to noxious stimuli bilaterally. Pupils were equal and reacted normally to light. There was a gaze preference to the left side; extraocular movements were intact and full however with oculocephalic maneuvers. Face was symmetrical with no focal weakness. Muscle tone was flaccid throughout. Patient had a moderately active tremor of both upper extremities. There were no purposeful movements of upper normal lower extremities. There was no abnormal posturing. Deep tendon reflexes were trace to 1+ and symmetrical. Plantar responses were extensor bilaterally.  No results found for: CHOL  Results for orders placed or performed during the hospital encounter of 09/12/14 (from the past 48 hour(s))  Ethanol     Status: None   Collection Time: 09/12/14  8:00 AM  Result Value Ref Range   Alcohol, Ethyl (B) <11 0 - 11 mg/dL    Comment:        LOWEST DETECTABLE LIMIT FOR SERUM ALCOHOL IS 11 mg/dL FOR MEDICAL PURPOSES ONLY   Protime-INR     Status: Abnormal   Collection Time: 09/12/14  8:00 AM  Result Value Ref Range   Prothrombin Time 16.7 (H) 11.6 - 15.2 seconds   INR 1.34 0.00 - 1.49  APTT     Status: None   Collection Time: 09/12/14  8:00 AM  Result Value Ref Range   aPTT 26 24 - 37 seconds  CBC     Status: Abnormal   Collection Time: 09/12/14  8:00 AM  Result Value Ref Range   WBC 16.5 (H) 4.0 - 10.5 K/uL   RBC 4.35  4.22 - 5.81 MIL/uL   Hemoglobin 11.4 (L) 13.0 - 17.0 g/dL   HCT 39.0 39.0 - 52.0 %   MCV 89.7 78.0 - 100.0 fL   MCH 26.2 26.0 - 34.0 pg   MCHC 29.2 (L) 30.0 - 36.0 g/dL   RDW 17.8 (H) 11.5 - 15.5 %   Platelets 171 150 - 400 K/uL  Differential     Status: Abnormal   Collection Time: 09/12/14  8:00 AM  Result Value Ref Range   Neutrophils Relative % 89 (H) 43 - 77 %   Neutro Abs 14.6 (H) 1.7 -  7.7 K/uL   Lymphocytes Relative 6 (L) 12 - 46 %   Lymphs Abs 1.0 0.7 - 4.0 K/uL   Monocytes Relative 5 3 - 12 %   Monocytes Absolute 0.9 0.1 - 1.0 K/uL   Eosinophils Relative 0 0 - 5 %   Eosinophils Absolute 0.0 0.0 - 0.7 K/uL   Basophils Relative 0 0 - 1 %   Basophils Absolute 0.0 0.0 - 0.1 K/uL  Comprehensive metabolic panel     Status: Abnormal   Collection Time: 09/12/14  8:00 AM  Result Value Ref Range   Sodium 150 (H) 137 - 147 mEq/L   Potassium 3.8 3.7 - 5.3 mEq/L   Chloride 95 (L) 96 - 112 mEq/L   CO2 24 19 - 32 mEq/L   Glucose, Bld 214 (H) 70 - 99 mg/dL   BUN 19 6 - 23 mg/dL   Creatinine, Ser 1.26 0.50 - 1.35 mg/dL   Calcium 8.1 (L) 8.4 - 10.5 mg/dL   Total Protein 6.8 6.0 - 8.3 g/dL   Albumin 3.0 (L) 3.5 - 5.2 g/dL   AST 27 0 - 37 U/L   ALT 9 0 - 53 U/L   Alkaline Phosphatase 60 39 - 117 U/L   Total Bilirubin 0.7 0.3 - 1.2 mg/dL   GFR calc non Af Amer 51 (L) >90 mL/min   GFR calc Af Amer 59 (L) >90 mL/min    Comment: (NOTE) The eGFR has been calculated using the CKD EPI equation. This calculation has not been validated in all clinical situations. eGFR's persistently <90 mL/min signify possible Chronic Kidney Disease.    Anion gap 31 (H) 5 - 15  Troponin I     Status: Abnormal   Collection Time: 09/12/14  8:00 AM  Result Value Ref Range   Troponin I 0.33 (HH) <0.30 ng/mL    Comment:        Due to the release kinetics of cTnI, a negative result within the first hours of the onset of symptoms does not rule out myocardial infarction with certainty. If myocardial infarction is still suspected, repeat the test at appropriate intervals. CRITICAL RESULT CALLED TO, READ BACK BY AND VERIFIED WITH: T SMITH AT 0905 ON 12.13.2015 BY NBROOKS   I-Stat CG4 Lactic Acid, ED     Status: Abnormal   Collection Time: 09/12/14  8:20 AM  Result Value Ref Range   Lactic Acid, Venous 13.78 (H) 0.5 - 2.2 mmol/L  I-Stat Chem 8, ED     Status: Abnormal   Collection Time: 09/12/14   8:22 AM  Result Value Ref Range   Sodium 147 137 - 147 mEq/L   Potassium 3.6 (L) 3.7 - 5.3 mEq/L   Chloride 98 96 - 112 mEq/L   BUN 21 6 - 23 mg/dL   Creatinine, Ser 1.20 0.50 - 1.35 mg/dL   Glucose, Bld 210 (H) 70 - 99  mg/dL   Calcium, Ion 0.92 (L) 1.13 - 1.30 mmol/L   TCO2 24 0 - 100 mmol/L   Hemoglobin 13.6 13.0 - 17.0 g/dL   HCT 40.0 39.0 - 52.0 %  Blood gas, arterial ([facility] & [facility] ONLY)     Status: None   Collection Time: 09/12/14  8:48 AM  Result Value Ref Range   O2 Content 3.0 L/min   Delivery systems NASAL CANNULA    pH, Arterial 7.422 7.350 - 7.450   pCO2 arterial 37.4 35.0 - 45.0 mmHg   pO2, Arterial 82.2 80.0 - 100.0 mmHg   Bicarbonate 23.7 20.0 - 24.0 mEq/L   TCO2 21.5 0 - 100 mmol/L   Acid-Base Excess 0.2 0.0 - 2.0 mmol/L   O2 Saturation 94.2 %   Patient temperature 99.6    Collection site LEFT RADIAL    Drawn by [id number]    Sample type ARTERIAL DRAW    Allens test (pass/fail) PASS PASS  Urine Drug Screen     Status: None   Collection Time: 09/12/14  8:59 AM  Result Value Ref Range   Opiates NONE DETECTED NONE DETECTED   Cocaine NONE DETECTED NONE DETECTED   Benzodiazepines NONE DETECTED NONE DETECTED   Amphetamines NONE DETECTED NONE DETECTED   Tetrahydrocannabinol NONE DETECTED NONE DETECTED   Barbiturates NONE DETECTED NONE DETECTED    Comment:        DRUG SCREEN FOR MEDICAL PURPOSES ONLY.  IF CONFIRMATION IS NEEDED FOR ANY PURPOSE, NOTIFY LAB WITHIN 5 DAYS.        LOWEST DETECTABLE LIMITS FOR URINE DRUG SCREEN Drug Class       Cutoff (ng/mL) Amphetamine      1000 Barbiturate      200 Benzodiazepine   628 Tricyclics       366 Opiates          300 Cocaine          300 THC              50   Urinalysis, Routine w reflex microscopic     Status: Abnormal   Collection Time: 09/12/14  8:59 AM  Result Value Ref Range   Color, Urine AMBER (A) YELLOW    Comment: BIOCHEMICALS MAY BE AFFECTED BY COLOR   APPearance CLEAR CLEAR   Specific Gravity, Urine 1.027  1.005 - 1.030   pH 5.5 5.0 - 8.0   Glucose, UA NEGATIVE NEGATIVE mg/dL   Hgb urine dipstick NEGATIVE NEGATIVE   Bilirubin Urine SMALL (A) NEGATIVE   Ketones, ur >80 (A) NEGATIVE mg/dL   Protein, ur 30 (A) NEGATIVE mg/dL   Urobilinogen, UA 0.2 0.0 - 1.0 mg/dL   Nitrite NEGATIVE NEGATIVE   Leukocytes, UA NEGATIVE NEGATIVE  Urine microscopic-add on     Status: None   Collection Time: 09/12/14  8:59 AM  Result Value Ref Range   Bacteria, UA RARE RARE   Urine-Other MUCOUS PRESENT     Ct Head Wo Contrast  09/12/2014   CLINICAL DATA:  behavioral changes x 1 day.Nurse note: EMS reports pt lives at home, increased confusion since yesterday, history of dementia, increased confusion which often happens with his frequent UTI"s . Decreased sats high 70's  EXAM: CT HEAD WITHOUT CONTRAST  TECHNIQUE: Contiguous axial images were obtained from the base of the skull through the vertex without intravenous contrast.  COMPARISON:  12/03/2011  FINDINGS: Atherosclerotic and physiologic intracranial calcifications. Diffuse parenchymal atrophy. Patchy areas of hypoattenuation in deep and periventricular white matter  bilaterally. Negative for acute intracranial hemorrhage, mass lesion, acute infarction, midline shift, or mass-effect. Acute infarct may be inapparent on noncontrast CT. Ventricles and sulci symmetric. Bone windows demonstrate no focal lesion.  IMPRESSION: 1. Negative for bleed or other acute intracranial process. 2. Stable atrophy and nonspecific white matter changes.   Electronically Signed   By: Arne Cleveland M.D.   On: 09/12/2014 08:41   Dg Chest Portable 1 View  09/12/2014   CLINICAL DATA:  SOb tody; dementia; hx HTN; ex smoker; diabetic  EXAM: PORTABLE CHEST - 1 VIEW  COMPARISON:  12/03/2011  FINDINGS: Dilated aortic arch and descending thoracic aorta. Previous median sternotomy. Stable cardiomegaly.  Patchy airspace opacities in the lingula or left lower lung, new since previous exam. No effusion.   IMPRESSION: 1. New left lower lung airspace disease suggesting pneumonia. 2. Thoracic aortic aneurysm. Consider thoracic MRA (lower radiation risk, can be performed noncontrast in the setting of renal dysfunction) or CTA ( higher spatial resolution) for further characterization.   Electronically Signed   By: Arne Cleveland M.D.   On: 09/12/2014 08:45    Assessment/Plan: 80 year old man with diabetes mellitus, COPD and hypertension as well as atrial fibrillation and dementia presenting with encephalopathic state with new onset seizure activity. CT scan of the head and clinical exam showed no signs of an acute focal deficit. However, an acute cerebral ischemic lesion cannot be ruled out at this point. New-onset seizures may be a manifestation of progression of dementia, as well. Acute infectious illness may be a contributing factor also to patient's encephalopathic state area.  Recommendations: 1. Continue Keppra at 500 mg every 12 hours. 2. MRI of the brain without contrast to rule out possible acute stroke. 3. EEG, routine adult study.  We will continue to follow this patient with you.  C.R. Nicole Kindred, MD [facility] [phone]  09/12/2014, 10:44 AM

## 2014-09-13 ENCOUNTER — Inpatient Hospital Stay (HOSPITAL_COMMUNITY): Payer: Medicare Other

## 2014-09-13 ENCOUNTER — Inpatient Hospital Stay (HOSPITAL_COMMUNITY)
Admission: EM | Admit: 2014-09-13 | Discharge: 2014-09-13 | Disposition: A | Discharge status: Home / Self Care | Payer: Medicare Other | Source: Home / Self Care | Attending: Internal Medicine | Admitting: Internal Medicine

## 2014-09-13 LAB — BASIC METABOLIC PANEL
Anion gap: 14 (ref 5–15)
BUN: 20 mg/dL (ref 6–23)
CHLORIDE: 107 meq/L (ref 96–112)
CO2: 28 mEq/L (ref 19–32)
CREATININE: 1.31 mg/dL (ref 0.50–1.35)
Calcium: 6.2 mg/dL — CL (ref 8.4–10.5)
GFR calc non Af Amer: 48 mL/min — ABNORMAL LOW (ref >90)
GFR, EST AFRICAN AMERICAN: 56 mL/min — AB (ref >90)
Glucose, Bld: 142 mg/dL — ABNORMAL HIGH (ref 70–99)
Potassium: 2.8 mEq/L — CL (ref 3.7–5.3)
SODIUM: 149 meq/L — AB (ref 137–147)

## 2014-09-13 LAB — GLUCOSE, CAPILLARY
GLUCOSE-CAPILLARY: 131 mg/dL — AB (ref 70–99)
Glucose-Capillary: 127 mg/dL — ABNORMAL HIGH (ref 70–99)
Glucose-Capillary: 128 mg/dL — ABNORMAL HIGH (ref 70–99)
Glucose-Capillary: 133 mg/dL — ABNORMAL HIGH (ref 70–99)
Glucose-Capillary: 149 mg/dL — ABNORMAL HIGH (ref 70–99)
Glucose-Capillary: 177 mg/dL — ABNORMAL HIGH (ref 70–99)

## 2014-09-13 LAB — CBC
HCT: 30.5 % — ABNORMAL LOW (ref 39.0–52.0)
Hemoglobin: 9.2 g/dL — ABNORMAL LOW (ref 13.0–17.0)
MCH: 26 pg (ref 26.0–34.0)
MCHC: 30.2 g/dL (ref 30.0–36.0)
MCV: 86.2 fL (ref 78.0–100.0)
Platelets: 141 10*3/uL — ABNORMAL LOW (ref 150–400)
RBC: 3.54 MIL/uL — ABNORMAL LOW (ref 4.22–5.81)
RDW: 17.9 % — AB (ref 11.5–15.5)
WBC: 9.2 10*3/uL (ref 4.0–10.5)

## 2014-09-13 LAB — URINE CULTURE
Colony Count: NO GROWTH
Culture: NO GROWTH

## 2014-09-13 LAB — MAGNESIUM: Magnesium: 0.4 mg/dL — CL (ref 1.5–2.5)

## 2014-09-13 MED ORDER — MAGNESIUM SULFATE 2 GM/50ML IV SOLN: 2.0000 g | Freq: Once | INTRAVENOUS | Status: DC

## 2014-09-13 MED ORDER — SODIUM CHLORIDE 0.9 % IV SOLN
2.0000 g | Freq: Once | INTRAVENOUS | Status: AC
  Administered 2014-09-13: 2 g via INTRAVENOUS
  Filled 2014-09-13: qty 20

## 2014-09-13 MED ORDER — DILTIAZEM HCL ER COATED BEADS 120 MG PO CP24: 120.0000 mg | ORAL_CAPSULE | Freq: Every day | ORAL | Status: DC

## 2014-09-13 MED ORDER — POTASSIUM CHLORIDE IN NACL 20-0.45 MEQ/L-% IV SOLN
INTRAVENOUS | Status: AC
  Administered 2014-09-13 – 2014-09-14 (×2): via INTRAVENOUS
  Filled 2014-09-13 (×4): qty 1000

## 2014-09-13 MED ORDER — MAGNESIUM SULFATE 4 GM/100ML IV SOLN
4.0000 g | Freq: Once | INTRAVENOUS | Status: AC
  Administered 2014-09-13: 4 g via INTRAVENOUS
  Filled 2014-09-13: qty 100

## 2014-09-13 MED ORDER — POTASSIUM CHLORIDE CRYS ER 20 MEQ PO TBCR: 40.0000 meq | EXTENDED_RELEASE_TABLET | Freq: Once | ORAL | Status: DC

## 2014-09-13 MED ORDER — POTASSIUM CHLORIDE 10 MEQ/100ML IV SOLN
10.0000 meq | INTRAVENOUS | Status: AC
  Administered 2014-09-13 (×3): 10 meq via INTRAVENOUS
  Filled 2014-09-13 (×3): qty 100

## 2014-09-13 MED ORDER — POTASSIUM CHLORIDE 10 MEQ/100ML IV SOLN
INTRAVENOUS | Status: AC
  Administered 2014-09-13: 10 meq via INTRAVENOUS
  Filled 2014-09-13: qty 100

## 2014-09-13 NOTE — Plan of Care (Signed)
Problem: Phase I Progression Outcomes Goal: OOB as tolerated unless otherwise ordered Outcome: Not Progressing Pt is nonambulatory per history.

## 2014-09-13 NOTE — Procedures (Addendum)
ELECTROENCEPHALOGRAM REPORT   Patient: Glen Miller       Room #: 1226 EEG No. ID: [id number] Age: 80 y.o.        Sex: male Referring Physician: Dr Randol KernElgergawy Report Date:  09/13/2014        Interpreting Physician: Elspeth ChoSUMNER, Anu Stagner, Jill AlexandersJUSTIN  History: Glen Miller is an 80 y.o. male with diabetes mellitus, COPD and hypertension as well as atrial fibrillation and dementia presenting with encephalopathic state with new onset seizure activity. CT scan of the head and clinical exam showed no signs of an acute focal deficit. However, an acute cerebral ischemic lesion cannot be ruled out at this point. New-onset seizures may be a manifestation of progression of dementia, as well. Acute infectious illness may be a contributing factor also to patient's encephalopathic state area.  Medications:  Scheduled: . ezetimibe  10 mg Oral q1800   And  . atorvastatin  20 mg Oral q1800  . heparin  5,000 Units Subcutaneous 3 times per day  . insulin aspart  0-9 Units Subcutaneous 6 times per day  . levETIRAcetam  500 mg Intravenous Q12H  . piperacillin-tazobactam (ZOSYN)  IV  3.375 g Intravenous Q8H  . sodium chloride  3 mL Intravenous Q12H  . vancomycin  750 mg Intravenous Q12H    Conditions of Recording:  This is a 16 channel EEG carried out with the patient in the drowsy state.  Description:  The waking background activity consists of a low voltage, symmetrical, fairly well organized, 8 to 9 Hz alpha activity, seen from the parieto-occipital and posterior temporal regions.  Low voltage fast activity, poorly organized, is seen anteriorly and is at times superimposed on more posterior regions.  A mixture of theta and alpha rhythms are seen from the central and temporal regions. Brief transient bilateral frontal delta activity noted. No focal slowing or epileptiform activity noted.  The patient drowses with slowing to irregular, low voltage theta and beta activity.  The patient does not drowse or  sleep.  Hyperventilation and photic stimulation was not performed.    IMPRESSION: The EEG shows mild background slowing with a poorly sustained posterior rhythm. This may represent a normal drowsy EEG but can also be seen in a mild metabolic encephalopathy.    Elspeth Choeter Juliene Kirsh, DO [facility] [phone]  If 7pm- 7am, please page neurology on call as listed in AMION. 09/13/2014, 1:33 PM

## 2014-09-13 NOTE — Progress Notes (Signed)
Utilization review completed.  

## 2014-09-13 NOTE — Progress Notes (Signed)
CRITICAL VALUE ALERT  Critical value received: K 2.8, Ca 6.2  Date of notification:  09/13/14  Time of notification:  0505  Critical value read back:Yes.    Nurse who received alert:  Barnett HatterMelinda Kallam RN  MD notified (1st page):  [facility] floor coverage  Time of first page:  0515  MD notified (2nd page):  Time of second page:  Responding MD:  Jennye BoroughsS.Osman PA  Time MD responded:  New order received

## 2014-09-13 NOTE — Progress Notes (Signed)
CRITICAL VALUE ALERT  Critical value received:  Mg 0.4   Date of notification:  09/13/14  Time of notification:  0603  Critical value read back:Yes.    Nurse who received alert:  Barnett HatterMelinda Kallam RN  MD notified (1st page):  S. Camila Lisman PA  Time of first page:  0610  MD notified (2nd page):  Time of second page:  Responding MD:  Jennye BoroughsS. Osman   Time MD responded:  [phone]

## 2014-09-13 NOTE — Progress Notes (Signed)
INITIAL NUTRITION ASSESSMENT  DOCUMENTATION CODES Per approved criteria  -Obesity Unspecified   INTERVENTION: -Diet advancement per MD -Diet textures per SLP -RD to continue to monitor  NUTRITION DIAGNOSIS: Inadequate oral intake related to inability to eat as evidenced by NPO status.   Goal: Pt to meet >/= 90% of their estimated nutrition needs    Monitor:  Diet order, swallow profile, labs, weights, total protein/kcal intake  Reason for Assessment: Low Braden  80 y.o. male  Admitting Dx: <principal problem not specified>  ASSESSMENT: Glen Miller is a 80 y.o. male, with past medical history of hypertension, diabetes mellitus, COPD, and atrial fibrillation not on anticoagulation, dementia, baseline bedridden/wheelchair dependent, verbal, brought by his daughters for altered mental status, family report patient has been more confused, lethargic, less responsive over the last 24 hours, which prompted them to bring him to ED, patient was noticed to have seizures, received loading dose of IV Keppra 1000 mg, and started on 500 mg IV twice a day as per neurology   -MD noted pt with possible aspiration PNA as pt with coughing during eating. -SLP attempted to evaluate on 12/14; however pt too lethargic and plan to re-attempt once pt more alert. Continues with NPO status -Pt unable to provide additional food/nutr hx. Denied changes in appetite or weight per RN nutrition screen. Will monitor diet advancement/appropriate textures and place nutritional supplement recommendations as warranted -Weight hx indicates pt with 25-30 lb weight loss over approximately one year (11% body weight loss, non-severe for time frame). Likely related to advancing age and progressive diseases (dementia, COPD, PNA) -K Low; being repleted IVF -Mg Low; recommend replacement  Height: Ht Readings from Last 1 Encounters:  09/12/14 5\' 9"  (1.753 m)    Weight: Wt Readings from Last 1 Encounters:  09/12/14 204  lb 2.3 oz (92.6 kg)    Ideal Body Weight: 160 lb  % Ideal Body Weight: 128%  Wt Readings from Last 10 Encounters:  09/12/14 204 lb 2.3 oz (92.6 kg)  09/13/14 200 lb (90.719 kg)  07/31/13 230 lb (104.327 kg)    Usual Body Weight: ~200 lb per med records  % Usual Body Weight: 100%  BMI:  Body mass index is 30.13 kg/(m^2).  Estimated Nutritional Needs: Kcal: 1700-1900 Protein: 90-100 gram Fluid: >/= 1900 ml daily  Skin: WDL  Diet Order: Diet NPO time specified Except for: Sips with Meds  EDUCATION NEEDS: -No education needs identified at this time   Intake/Output Summary (Last 24 hours) at 09/13/14 1507 Last data filed at 09/13/14 1201  Gross per 24 hour  Intake 3151.33 ml  Output    350 ml  Net 2801.33 ml    Last BM: PTA; active BS  Labs:   Recent Labs Lab 09/12/14 0800 09/12/14 0822 09/13/14 0334  NA 150* 147 149*  K 3.8 3.6* 2.8*  CL 95* 98 107  CO2 24  --  28  BUN 19 21 20   CREATININE 1.26 1.20 1.31  CALCIUM 8.1*  --  6.2*  MG  --   --  0.4*  GLUCOSE 214* 210* 142*    CBG (last 3)   Recent Labs  09/13/14 0447 09/13/14 0800 09/13/14 1233  GLUCAP 149* 131* 128*    Scheduled Meds: . ezetimibe  10 mg Oral q1800   And  . atorvastatin  20 mg Oral q1800  . heparin  5,000 Units Subcutaneous 3 times per day  . insulin aspart  0-9 Units Subcutaneous 6 times per day  .  levETIRAcetam  500 mg Intravenous Q12H  . piperacillin-tazobactam (ZOSYN)  IV  3.375 g Intravenous Q8H  . sodium chloride  3 mL Intravenous Q12H  . vancomycin  750 mg Intravenous Q12H    Continuous Infusions: . 0.45 % NaCl with KCl 20 mEq / L 100 mL/hr at 09/13/14 0807  . diltiazem (CARDIZEM) infusion 15 mg/hr (09/13/14 1339)    Past Medical History  Diagnosis Date  . Hypertension   . Diabetes mellitus   . COPD (chronic obstructive pulmonary disease)   . Dementia   . Atrial fibrillation     Past Surgical History  Procedure Laterality Date  . Kidney stones    .  Back surgery    . Exploration post operative open heart    . Triple bypass    . Cholecystectomy    . Knee arthroscopy    . Appendectomy    . Eye surgery    . Joint replacement      Lloyd HugerSarah F Jahleah Mariscal MS RD LDN Clinical Dietitian [phone]

## 2014-09-13 NOTE — Progress Notes (Signed)
Subjective: No overnight events. No further seizure activity. Family present in room, state he is still not talking to them.   History: Glen Miller is an 80 y.o. male history of hypertension, diabetes mellitus, COPD, atrial fibrillation not on anticoagulation, and dementia brought to the emergency room for evaluation of increasing alteration in mental status. Shortly after arriving in the emergency room he had a generalized seizure. Patient also had a witnessed subsequent generalized seizure, and partial seizure activity during this evaluation. He was given IV Ativan and also charted on a loading dose of Keppra 1000 mg IV. He was given an additional 1 mg of Ativan for partial seizure activity prior to completion of IV Keppra load. No further seizure activity was seen. There's no previous history of seizures. Family indicated that he had become progressively more confused over several days. He has a history of urinary tract infections with increased confusion. Urinalysis today showed only of a small number of leukocytes. Microscopic exam was normal. Rectal temperature was 99.6. WBC count was 16.5 with 89% neutrophils. CT scan of his head showed no acute intracranial abnormality.  CT head imaging reviewed and overall unremarkable.   Objective: Current vital signs: BP 131/70 mmHg  Pulse 101  Temp(Src) 98.4 F (36.9 C) (Oral)  Resp 30  Ht 5\' 9"  (1.753 m)  Wt 92.6 kg (204 lb 2.3 oz)  BMI 30.13 kg/m2  SpO2 95% Vital signs in last 24 hours: Temp:  [98 F (36.7 C)-101.2 F (38.4 C)] 98.4 F (36.9 C) (12/14 0400) Pulse Rate:  [28-284] 101 (12/14 0700) Resp:  [23-38] 30 (12/14 0700) BP: (95-226)/(39-175) 131/70 mmHg (12/14 0700) SpO2:  [89 %-100 %] 95 % (12/14 0700) Weight:  [92.6 kg (204 lb 2.3 oz)-99.791 kg (220 lb)] 92.6 kg (204 lb 2.3 oz) (12/13 1453)  Intake/Output from previous day: 12/13 0701 - 12/14 0700 In: 8385.2 [I.V.:7822.7; IV Piggyback:562.5] Out: 350  [Urine:350] Intake/Output this shift: Total I/O In: 200 [IV Piggyback:200] Out: -  Nutritional status: Diet NPO time specified Except for: Sips with Meds  Neurologic Exam: Eyes opened spontaneously and to verbal stimuli. Non-verbal, not following commands Pupils were equal and reacted normally to light. No gaze preference noted; would not follow finger but appeared to briefly track examiner to right and left Face was symmetrical with no focal weakness. Muscle tone was flaccid throughout. Patient had a moderately active tremor of both upper extremities. There were no purposeful movements of upper normal lower extremities. There was no abnormal posturing. Deep tendon reflexes were trace to 1+ and symmetrical. Plantar responses were extensor bilaterally.  Lab Results: Basic Metabolic Panel:  Recent Labs Lab 09/12/14 0800 09/12/14 0822 09/13/14 0334  NA 150* 147 149*  K 3.8 3.6* 2.8*  CL 95* 98 107  CO2 24  --  28  GLUCOSE 214* 210* 142*  BUN 19 21 20   CREATININE 1.26 1.20 1.31  CALCIUM 8.1*  --  6.2*  MG  --   --  0.4*    Liver Function Tests:  Recent Labs Lab 09/12/14 0800  AST 27  ALT 9  ALKPHOS 60  BILITOT 0.7  PROT 6.8  ALBUMIN 3.0*   No results for input(s): LIPASE, AMYLASE in the last 168 hours. No results for input(s): AMMONIA in the last 168 hours.  CBC:  Recent Labs Lab 09/12/14 0800 09/12/14 0822 09/13/14 0334  WBC 16.5*  --  9.2  NEUTROABS 14.6*  --   --   HGB 11.4* 13.6 9.2*  HCT  39.0 40.0 30.5*  MCV 89.7  --  86.2  PLT 171  --  141*    Cardiac Enzymes:  Recent Labs Lab 09/12/14 0800  TROPONINI 0.33*    Lipid Panel: No results for input(s): CHOL, TRIG, HDL, CHOLHDL, VLDL, LDLCALC in the last 168 hours.  CBG:  Recent Labs Lab 09/12/14 1613 09/12/14 2017 09/13/14 0013 09/13/14 0447 09/13/14 0800  GLUCAP 184* 151* 177* 149* 131*    Microbiology: Results for orders placed or performed during the hospital encounter of  09/12/14  MRSA PCR Screening     Status: None   Collection Time: 09/12/14  3:47 PM  Result Value Ref Range Status   MRSA by PCR NEGATIVE NEGATIVE Final    Comment:        The GeneXpert MRSA Assay (FDA approved for NASAL specimens only), is one component of a comprehensive MRSA colonization surveillance program. It is not intended to diagnose MRSA infection nor to guide or monitor treatment for MRSA infections.     Coagulation Studies:  Recent Labs  09/12/14 0800  LABPROT 16.7*  INR 1.34    Imaging: Ct Head Wo Contrast  09/12/2014   CLINICAL DATA:  behavioral changes x 1 day.Nurse note: EMS reports pt lives at home, increased confusion since yesterday, history of dementia, increased confusion which often happens with his frequent UTI"s . Decreased sats high 70's  EXAM: CT HEAD WITHOUT CONTRAST  TECHNIQUE: Contiguous axial images were obtained from the base of the skull through the vertex without intravenous contrast.  COMPARISON:  12/03/2011  FINDINGS: Atherosclerotic and physiologic intracranial calcifications. Diffuse parenchymal atrophy. Patchy areas of hypoattenuation in deep and periventricular white matter bilaterally. Negative for acute intracranial hemorrhage, mass lesion, acute infarction, midline shift, or mass-effect. Acute infarct may be inapparent on noncontrast CT. Ventricles and sulci symmetric. Bone windows demonstrate no focal lesion.  IMPRESSION: 1. Negative for bleed or other acute intracranial process. 2. Stable atrophy and nonspecific white matter changes.   Electronically Signed   By: Oley Balm M.D.   On: 09/12/2014 08:41   Dg Chest Portable 1 View  09/12/2014   CLINICAL DATA:  SOb tody; dementia; hx HTN; ex smoker; diabetic  EXAM: PORTABLE CHEST - 1 VIEW  COMPARISON:  12/03/2011  FINDINGS: Dilated aortic arch and descending thoracic aorta. Previous median sternotomy. Stable cardiomegaly.  Patchy airspace opacities in the lingula or left lower lung, new  since previous exam. No effusion.  IMPRESSION: 1. New left lower lung airspace disease suggesting pneumonia. 2. Thoracic aortic aneurysm. Consider thoracic MRA (lower radiation risk, can be performed noncontrast in the setting of renal dysfunction) or CTA ( higher spatial resolution) for further characterization.   Electronically Signed   By: Oley Balm M.D.   On: 09/12/2014 08:45    Medications:  Scheduled: . ezetimibe  10 mg Oral q1800   And  . atorvastatin  20 mg Oral q1800  . calcium gluconate  2 g Intravenous Once  . heparin  5,000 Units Subcutaneous 3 times per day  . insulin aspart  0-9 Units Subcutaneous 6 times per day  . levETIRAcetam  500 mg Intravenous Q12H  . magnesium sulfate 1 - 4 g bolus IVPB  4 g Intravenous Once  . piperacillin-tazobactam (ZOSYN)  IV  3.375 g Intravenous Q8H  . sodium chloride  3 mL Intravenous Q12H  . vancomycin  750 mg Intravenous Q12H    Assessment/Plan: 80 year old man with diabetes mellitus, COPD and hypertension as well as atrial fibrillation and dementia  presenting with encephalopathic state with new onset seizure activity. CT scan of the head and clinical exam showed no signs of an acute focal deficit. However, an acute cerebral ischemic lesion cannot be ruled out at this point. New-onset seizures may be a manifestation of progression of dementia, as well. Acute infectious illness may be a contributing factor also to patient's encephalopathic state area.  -awaiting MRI and EEG -metabolic corrections per primary team -will continue to monitor. Further workup pending above results.    LOS: 1 day   Elspeth Choeter Angie Hogg, DO [facility] [phone]  If 7pm- 7am, please page neurology on call as listed in AMION. 09/13/2014  9:43 AM

## 2014-09-13 NOTE — Evaluation (Signed)
SLP Cancellation Note  Patient Details Name: Glen BarrenSamuel V Shartzer MRN: [id number] DOB: 15-Jan-1928   Cancelled treatment:       Reason Eval/Treat Not Completed: Fatigue/lethargy limiting ability to participate (spoke to RN and pt too lethargic for eval/po, will reattempt evaluation next date if pt alert)   Donavan Burnetamara Caron Ode, MS [facility] SLP [phone]

## 2014-09-13 NOTE — Progress Notes (Signed)
Patient Demographics  Glen Miller, is a 80 y.o. male, DOB - January 04, 1928, OZD:[id number]  Admit date - 09/12/2014   Admitting Physician Starleen Arms, MD  Outpatient Primary MD for the patient is Georgann Housekeeper, MD  LOS - 1   Chief Complaint  Patient presents with  . Altered Mental Status      Admission history of present illness/brief narrative:  Glen Miller is a 80 y.o. male, with past medical history of hypertension, diabetes mellitus, COPD, and atrial fibrillation not on anticoagulation, dementia, baseline bedridden/wheelchair dependent, verbal, brought by his daughters for altered mental status, family report patient has been more confused, lethargic, less responsive over the last 24 hours, which prompted them to bring him to ED,  patient was noticed to have seizures, received loading dose of IV Keppra 1000 mg, and started on 500 mg IV twice a day as per neurology recommendation, seen by neurology service, with recommendation of MRI brain, continue , EEG. No further seizures since admission. workup was significant for pneumonia, most likely aspiration as family report coughing during eating, chest x-ray showing evidence of infiltrate/opacity, patient was started on IV vancomycin twice and and Zosyn . as well patient was noticed to have hypernatremia, he has known history of A. fib, was in A. fib with RVR requiring Cardizem drip initially.  Subjective:   Glen Miller is nonverbal, no further seizures, had urinary retention and required Foley insertion overnight.  Assessment & Plan    Active Problems:   Generalized seizures   Dementia   Chronic atrial fibrillation   Acute encephalopathy   PNA (pneumonia)   Hypernatremia  Seizures: - Etiology is unclear, and neurology consult appreciated, will obtain MRI brain to rule out CVA, will obtain EEG, will  continue with Keppra 500 mg IV twice a day, seizure precautions. - Possibly multifactorial related to hypernatremia, infection. - No recurrence since admission to stepdown.  Acute encephalopathy -Secondary to hypernatremia, infectious process, and seizures, CT head does not show any acute findings, will obtain MRI of the brain. -Poor baseline mental status secondary to dementia. -Continues to improve gradually.  Atrial fibrillation with RVR -Patient initially on IV Cardizem drip, currently rate controlled without medication, and rhythm alternating between normal sinus and A. Fib.  Hypernatremia -Volume depletion and dehydration. -Continue with D5 half-normal saline. -Monitor closely.  Pneumonia -Likely aspiration pneumonia, consult SLP, start on vancomycin and Zosyn  Multiple electrolyte abnormalities including hypokalemia, hypocalcemia, hypomagnesemia. - We'll continue to replace, check level in a.m.  DVT Prophylaxis Heparin  Code Status: DO NOT RESUSCITATE  Family Communication: Daughter at bedside  Disposition Plan: Remains in stepdown   Procedures  None   Consults  Neurology   Medications  Scheduled Meds: . ezetimibe  10 mg Oral q1800   And  . atorvastatin  20 mg Oral q1800  . calcium gluconate  2 g Intravenous Once  . heparin  5,000 Units Subcutaneous 3 times per day  . insulin aspart  0-9 Units Subcutaneous 6 times per day  . levETIRAcetam  500 mg Intravenous Q12H  . piperacillin-tazobactam (ZOSYN)  IV  3.375 g Intravenous Q8H  . sodium chloride  3 mL Intravenous Q12H  . vancomycin  750 mg Intravenous Q12H   Continuous Infusions: . 0.45 %  NaCl with KCl 20 mEq / L 100 mL/hr at 09/13/14 0807  . diltiazem (CARDIZEM) infusion Stopped (09/12/14 2300)   PRN Meds:.acetaminophen **OR** acetaminophen, albuterol, LORazepam, nitroGLYCERIN, ondansetron **OR** ondansetron (ZOFRAN) IV, tiotropium  DVT Prophylaxis  Heparin -  Lab Results  Component Value Date    PLT 141* 09/13/2014    Antibiotics    Anti-infectives    Start     Dose/Rate Route Frequency Ordered Stop   09/13/14 0800  cefTRIAXone (ROCEPHIN) 1 g in dextrose 5 % 50 mL IVPB  Status:  Discontinued     1 g100 mL/hr over 30 Minutes Intravenous Every 24 hours 09/12/14 1122 09/12/14 1224   09/13/14 0800  azithromycin (ZITHROMAX) 500 mg in dextrose 5 % 250 mL IVPB  Status:  Discontinued     500 mg250 mL/hr over 60 Minutes Intravenous Every 24 hours 09/12/14 1122 09/12/14 1224   09/12/14 2200  piperacillin-tazobactam (ZOSYN) IVPB 3.375 g     3.375 g12.5 mL/hr over 240 Minutes Intravenous Every 8 hours 09/12/14 1228     09/12/14 1300  piperacillin-tazobactam (ZOSYN) IVPB 3.375 g     3.375 g100 mL/hr over 30 Minutes Intravenous  Once 09/12/14 1227 09/12/14 1622   09/12/14 1300  vancomycin (VANCOCIN) IVPB 750 mg/150 ml premix     750 mg150 mL/hr over 60 Minutes Intravenous Every 12 hours 09/12/14 1229     09/12/14 0930  cefTRIAXone (ROCEPHIN) 1 g in dextrose 5 % 50 mL IVPB     1 g100 mL/hr over 30 Minutes Intravenous  Once 09/12/14 0925 09/12/14 1051   09/12/14 0930  azithromycin (ZITHROMAX) 500 mg in dextrose 5 % 250 mL IVPB     500 mg250 mL/hr over 60 Minutes Intravenous  Once 09/12/14 0925 09/12/14 1122          Objective:   Filed Vitals:   09/13/14 0600 09/13/14 0630 09/13/14 0700 09/13/14 0800  BP: 136/67 122/83 131/70   Pulse: 67 78 101   Temp:    98.6 F (37 C)  TempSrc:    Axillary  Resp: 31 31 30    Height:      Weight:      SpO2: 97% 93% 95%     Wt Readings from Last 3 Encounters:  09/12/14 92.6 kg (204 lb 2.3 oz)  09/13/14 90.719 kg (200 lb)  07/31/13 104.327 kg (230 lb)     Intake/Output Summary (Last 24 hours) at 09/13/14 1008 Last data filed at 09/13/14 0807  Gross per 24 hour  Intake 8585.18 ml  Output    350 ml  Net 8235.18 ml     Physical Exam  Appears more awake today, open eyes, mumbles few words. [place].AT,PERRAL Supple Neck,No JVD, No cervical  lymphadenopathy appriciated.  Symmetrical Chest wall movement, Good air movement bilaterally, no wheezing Irregular irregular,No Gallops,Rubs or new Murmurs, No Parasternal Heave +ve B.Sounds, Abd Soft, No tenderness, No organomegaly appriciated, No rebound - guarding or rigidity. No Cyanosis, Clubbing or edema, No new Rash or bruise     Data Review   Micro Results Recent Results (from the past 240 hour(s))  MRSA PCR Screening     Status: None   Collection Time: 09/12/14  3:47 PM  Result Value Ref Range Status   MRSA by PCR NEGATIVE NEGATIVE Final    Comment:        The GeneXpert MRSA Assay (FDA approved for NASAL specimens only), is one component of a comprehensive MRSA colonization surveillance program. It is not intended to diagnose  MRSA infection nor to guide or monitor treatment for MRSA infections.     Radiology Reports Ct Head Wo Contrast  09/12/2014   CLINICAL DATA:  behavioral changes x 1 day.Nurse note: EMS reports pt lives at home, increased confusion since yesterday, history of dementia, increased confusion which often happens with his frequent UTI"s . Decreased sats high 70's  EXAM: CT HEAD WITHOUT CONTRAST  TECHNIQUE: Contiguous axial images were obtained from the base of the skull through the vertex without intravenous contrast.  COMPARISON:  12/03/2011  FINDINGS: Atherosclerotic and physiologic intracranial calcifications. Diffuse parenchymal atrophy. Patchy areas of hypoattenuation in deep and periventricular white matter bilaterally. Negative for acute intracranial hemorrhage, mass lesion, acute infarction, midline shift, or mass-effect. Acute infarct may be inapparent on noncontrast CT. Ventricles and sulci symmetric. Bone windows demonstrate no focal lesion.  IMPRESSION: 1. Negative for bleed or other acute intracranial process. 2. Stable atrophy and nonspecific white matter changes.   Electronically Signed   By: Oley Balmaniel  Hassell M.D.   On: 09/12/2014 08:41   Dg  Chest Portable 1 View  09/12/2014   CLINICAL DATA:  SOb tody; dementia; hx HTN; ex smoker; diabetic  EXAM: PORTABLE CHEST - 1 VIEW  COMPARISON:  12/03/2011  FINDINGS: Dilated aortic arch and descending thoracic aorta. Previous median sternotomy. Stable cardiomegaly.  Patchy airspace opacities in the lingula or left lower lung, new since previous exam. No effusion.  IMPRESSION: 1. New left lower lung airspace disease suggesting pneumonia. 2. Thoracic aortic aneurysm. Consider thoracic MRA (lower radiation risk, can be performed noncontrast in the setting of renal dysfunction) or CTA ( higher spatial resolution) for further characterization.   Electronically Signed   By: Oley Balmaniel  Hassell M.D.   On: 09/12/2014 08:45    CBC  Recent Labs Lab 09/12/14 0800 09/12/14 0822 09/13/14 0334  WBC 16.5*  --  9.2  HGB 11.4* 13.6 9.2*  HCT 39.0 40.0 30.5*  PLT 171  --  141*  MCV 89.7  --  86.2  MCH 26.2  --  26.0  MCHC 29.2*  --  30.2  RDW 17.8*  --  17.9*  LYMPHSABS 1.0  --   --   MONOABS 0.9  --   --   EOSABS 0.0  --   --   BASOSABS 0.0  --   --     Chemistries   Recent Labs Lab 09/12/14 0800 09/12/14 0822 09/13/14 0334  NA 150* 147 149*  K 3.8 3.6* 2.8*  CL 95* 98 107  CO2 24  --  28  GLUCOSE 214* 210* 142*  BUN 19 21 20   CREATININE 1.26 1.20 1.31  CALCIUM 8.1*  --  6.2*  MG  --   --  0.4*  AST 27  --   --   ALT 9  --   --   ALKPHOS 60  --   --   BILITOT 0.7  --   --    ------------------------------------------------------------------------------------------------------------------ estimated creatinine clearance is 47.2 mL/min (by C-G formula based on Cr of 1.31). ------------------------------------------------------------------------------------------------------------------ No results for input(s): HGBA1C in the last 72 hours. ------------------------------------------------------------------------------------------------------------------ No results for input(s): CHOL, HDL,  LDLCALC, TRIG, CHOLHDL, LDLDIRECT in the last 72 hours. ------------------------------------------------------------------------------------------------------------------ No results for input(s): TSH, T4TOTAL, T3FREE, THYROIDAB in the last 72 hours.  Invalid input(s): FREET3 ------------------------------------------------------------------------------------------------------------------ No results for input(s): VITAMINB12, FOLATE, FERRITIN, TIBC, IRON, RETICCTPCT in the last 72 hours.  Coagulation profile  Recent Labs Lab 09/12/14 0800  INR 1.34    No results  for input(s): DDIMER in the last 72 hours.  Cardiac Enzymes  Recent Labs Lab 09/12/14 0800  TROPONINI 0.33*   ------------------------------------------------------------------------------------------------------------------ Invalid input(s): POCBNP     Time Spent in minutes   35 minutes   Nurah Petrides M.D on 09/13/2014 at 10:08 AM  Between 7am to 7pm - Pager - [phone]  After 7pm go to [email or web address] - password [email or web address]  And look for the night coverage person covering for me after hours  [facility] Office  [phone]   **Disclaimer: This note may have been dictated with voice recognition software. Similar sounding words can inadvertently be transcribed and this note may contain transcription errors which may not have been corrected upon publication of note.**

## 2014-09-13 NOTE — Progress Notes (Signed)
EEG completed, results pending. 

## 2014-09-14 DIAGNOSIS — I4891 Unspecified atrial fibrillation: Secondary | ICD-10-CM

## 2014-09-14 DIAGNOSIS — J189 Pneumonia, unspecified organism: Secondary | ICD-10-CM

## 2014-09-14 LAB — BASIC METABOLIC PANEL
ANION GAP: 14 (ref 5–15)
BUN: 20 mg/dL (ref 6–23)
CO2: 26 mEq/L (ref 19–32)
Calcium: 6.6 mg/dL — ABNORMAL LOW (ref 8.4–10.5)
Chloride: 105 mEq/L (ref 96–112)
Creatinine, Ser: 1.13 mg/dL (ref 0.50–1.35)
GFR calc Af Amer: 67 mL/min — ABNORMAL LOW (ref >90)
GFR calc non Af Amer: 58 mL/min — ABNORMAL LOW (ref >90)
GLUCOSE: 164 mg/dL — AB (ref 70–99)
Potassium: 3.2 mEq/L — ABNORMAL LOW (ref 3.7–5.3)
Sodium: 145 mEq/L (ref 137–147)

## 2014-09-14 LAB — CBC
HCT: 31.3 % — ABNORMAL LOW (ref 39.0–52.0)
HEMOGLOBIN: 9.3 g/dL — AB (ref 13.0–17.0)
MCH: 25.8 pg — ABNORMAL LOW (ref 26.0–34.0)
MCHC: 29.7 g/dL — ABNORMAL LOW (ref 30.0–36.0)
MCV: 86.7 fL (ref 78.0–100.0)
Platelets: 140 10*3/uL — ABNORMAL LOW (ref 150–400)
RBC: 3.61 MIL/uL — AB (ref 4.22–5.81)
RDW: 17.9 % — ABNORMAL HIGH (ref 11.5–15.5)
WBC: 9.8 10*3/uL (ref 4.0–10.5)

## 2014-09-14 LAB — GLUCOSE, CAPILLARY
GLUCOSE-CAPILLARY: 132 mg/dL — AB (ref 70–99)
GLUCOSE-CAPILLARY: 134 mg/dL — AB (ref 70–99)
GLUCOSE-CAPILLARY: 153 mg/dL — AB (ref 70–99)
GLUCOSE-CAPILLARY: 119 mg/dL — AB (ref 70–99)
Glucose-Capillary: 137 mg/dL — ABNORMAL HIGH (ref 70–99)
Glucose-Capillary: 143 mg/dL — ABNORMAL HIGH (ref 70–99)
Glucose-Capillary: 148 mg/dL — ABNORMAL HIGH (ref 70–99)
Glucose-Capillary: 176 mg/dL — ABNORMAL HIGH (ref 70–99)

## 2014-09-14 LAB — VANCOMYCIN, TROUGH: Vancomycin Tr: 13.8 ug/mL (ref 10.0–20.0)

## 2014-09-14 LAB — PHOSPHORUS: Phosphorus: 3.1 mg/dL (ref 2.3–4.6)

## 2014-09-14 LAB — MAGNESIUM: MAGNESIUM: 1.3 mg/dL — AB (ref 1.5–2.5)

## 2014-09-14 MED ORDER — LEVETIRACETAM 500 MG PO TABS
500.0000 mg | ORAL_TABLET | Freq: Two times a day (BID) | ORAL | Status: DC
  Administered 2014-09-14 – 2014-09-20 (×12): 500 mg via ORAL
  Filled 2014-09-14 (×13): qty 1

## 2014-09-14 MED ORDER — MAGNESIUM SULFATE 2 GM/50ML IV SOLN
2.0000 g | Freq: Once | INTRAVENOUS | Status: AC
  Administered 2014-09-14: 2 g via INTRAVENOUS
  Filled 2014-09-14: qty 50

## 2014-09-14 MED ORDER — DILTIAZEM HCL 60 MG PO TABS
120.0000 mg | ORAL_TABLET | Freq: Three times a day (TID) | ORAL | Status: DC
  Administered 2014-09-14 – 2014-09-15 (×3): 120 mg via ORAL
  Filled 2014-09-14 (×3): qty 2

## 2014-09-14 MED ORDER — CALCIUM GLUCONATE 10 % IV SOLN
2.0000 g | Freq: Once | INTRAVENOUS | Status: AC
  Administered 2014-09-14: 2 g via INTRAVENOUS
  Filled 2014-09-14: qty 20

## 2014-09-14 MED ORDER — VANCOMYCIN HCL IN DEXTROSE 1-5 GM/200ML-% IV SOLN
1000.0000 mg | Freq: Two times a day (BID) | INTRAVENOUS | Status: DC
  Administered 2014-09-15: 1000 mg via INTRAVENOUS
  Filled 2014-09-14: qty 200

## 2014-09-14 MED ORDER — SODIUM CHLORIDE 0.9 % IV SOLN
INTRAVENOUS | Status: DC
  Administered 2014-09-15: 01:00:00 via INTRAVENOUS

## 2014-09-14 MED ORDER — METOPROLOL TARTRATE 12.5 MG HALF TABLET
12.5000 mg | ORAL_TABLET | Freq: Two times a day (BID) | ORAL | Status: DC
  Administered 2014-09-14 – 2014-09-16 (×2): 12.5 mg via ORAL
  Filled 2014-09-14 (×3): qty 1

## 2014-09-14 MED ORDER — POTASSIUM CHLORIDE 10 MEQ/100ML IV SOLN
10.0000 meq | INTRAVENOUS | Status: AC
  Administered 2014-09-14 (×4): 10 meq via INTRAVENOUS
  Filled 2014-09-14 (×4): qty 100

## 2014-09-14 NOTE — Progress Notes (Signed)
ANTIBIOTIC CONSULT NOTE - FOLLOW-UP  Pharmacy Consult for Vancomycin, Zosyn Indication: pneumonia  No Known Allergies  Patient Measurements: Height: 5\' 9"  (175.3 cm) Weight: 212 lb 8.4 oz (96.4 kg) IBW/kg (Calculated) : 70.7   Vital Signs: Temp: 99.6 F (37.6 C) (12/15 1200) Temp Source: Axillary (12/15 1200) BP: 120/69 mmHg (12/15 0920) Pulse Rate: 64 (12/15 0900) Intake/Output from previous day: 12/14 0701 - 12/15 0700 In: 3358.3 [I.V.:2288.3; IV Piggyback:1070] Out: 715 [Urine:715] Intake/Output from this shift: Total I/O In: 468.3 [I.V.:48.3; IV Piggyback:420] Out: -   Labs:  Recent Labs  09/12/14 0800 09/12/14 0822 09/13/14 0334 09/14/14 0348  WBC 16.5*  --  9.2 9.8  HGB 11.4* 13.6 9.2* 9.3*  PLT 171  --  141* 140*  CREATININE 1.26 1.20 1.31 1.13   Estimated Creatinine Clearance: 55.8 mL/min (by C-G formula based on Cr of 1.13).  Recent Labs  09/14/14 1240  VANCOTROUGH 13.8     Microbiology: Recent Results (from the past 720 hour(s))  Body fluid culture     Status: None   Collection Time: 08/16/14 10:40 PM  Result Value Ref Range Status   Specimen Description KNEE RIGHT  Final   Special Requests Normal  Final   Gram Stain   Final    ABUNDANT WBC PRESENT, PREDOMINANTLY PMN NO ORGANISMS SEEN [facility]formed [facility]    Culture   Final    NO GROWTH 3 DAYS Performed at [facility]    Report Status 08/20/2014 FINAL  Final  Blood culture (routine x 2)     Status: None (Preliminary result)   Collection Time: 09/12/14  9:23 AM  Result Value Ref Range Status   Specimen Description BLOOD RIGHT ANTECUBITAL  Final   Special Requests BOTTLES DRAWN AEROBIC AND ANAEROBIC 5 CC EACH  Final   Culture  Setup Time   Final    09/12/2014 19:25 Performed at [facility]    Culture   Final           BLOOD CULTURE RECEIVED NO GROWTH TO DATE CULTURE WILL BE HELD FOR 5 DAYS BEFORE ISSUING A FINAL NEGATIVE REPORT [facility]formed at [facility]    Report Status PENDING  Incomplete  Blood culture (routine x 2)     Status: None (Preliminary result)   Collection Time: 09/12/14  9:23 AM  Result Value Ref Range Status   Specimen Description BLOOD LEFT ANTECUBITAL  Final   Special Requests BOTTLES DRAWN AEROBIC AND ANAEROBIC 5 CC EACH  Final   Culture  Setup Time   Final    09/12/2014 19:25 Performed at [facility]    Culture   Final           BLOOD CULTURE RECEIVED NO GROWTH TO DATE CULTURE WILL BE HELD FOR 5 DAYS BEFORE ISSUING A FINAL NEGATIVE REPORT Performed at [facility]    Report Status PENDING  Incomplete  Urine culture     Status: None   Collection Time: 09/12/14 10:10 AM  Result Value Ref Range Status   Specimen Description URINE, CATHETERIZED  Final   Special Requests NONE  Final   Culture  Setup Time   Final    09/12/2014 15:54 Performed at [facility]    Colony Count NO GROWTH Performed at [facility]   Final   Culture NO GROWTH Performed at [facility]   Final   Report Status 09/13/2014 FINAL  Final  MRSA PCR Screening  Status: None   Collection Time: 09/12/14  3:47 PM  Result Value Ref Range Status   MRSA by PCR NEGATIVE NEGATIVE Final    Comment:        The GeneXpert MRSA Assay (FDA approved for NASAL specimens only), is one component of a comprehensive MRSA colonization surveillance program. It is not intended to diagnose MRSA infection nor to guide or monitor treatment for MRSA infections.     Medical History: Past Medical History  Diagnosis Date  . Hypertension   . Diabetes mellitus   . COPD (chronic obstructive pulmonary disease)   . Dementia   . Atrial fibrillation     Assessment: [age] yoM with PMH of HTN, DM, COPD, a-fib not on anticoagulation, dementia admitted 12/13 with AMS.  Patient with seizure activity in ED, found to be hypernatremic, and in a-fib.  CXR showed evidence of infiltrate/opacity.  Pharmacy consulted to  assist with dosing of Vancomycin and Zosyn for possible aspiration pneumonia.   12/13 >> Ceftriaxone x1 12/13 >> Azithromycin x1 12/13 >> Vancomycin >> 12/13 >>  Zosyn >>  Tmax: 100.4 WBCs: decreased to WNL Renal: Scr improved to 1.13, CrCl ~ 56 mL/min CG Vancomycin trough level 13.8 mcg/mL (subtherapeutic)-RN confirms that dose hung AFTER lab drawn  12/13 blood x 2: NGTD 12/13 urine: NGF 12/13 MRSA PCR: negative  Goal of Therapy:  Vancomycin trough level 15-20 mcg/ml  Appropriate antibiotic dosing for renal function Eradication of infection  Plan:   Increase Vancomycin to 1000 mg IV q12h.  Plan to check Vancomycin trough level at new state, pending duration of therapy.  Continue Zosyn 3.375g IV q8h (infuse over 4 hours)  Continue to monitor renal indices, cultures, clinical course.   Greer PickerelJigna Lossie Kalp, PharmD, BCPS Pager: [phone] 09/14/2014 2:52 PM

## 2014-09-14 NOTE — Progress Notes (Addendum)
Subjective: Marked improvement in mental status overnight. EEG shows mild slowing but overall unremarkable. MRI brain shows no acute process.   Patients daughter notes he has had a tremor for years. Based on her description it appears to be predominantly a postural/intention tremor. Of note, the patients mother had a diagnosis of PD.   History: Glen Miller is an 80 y.o. male history of hypertension, diabetes mellitus, COPD, atrial fibrillation not on anticoagulation, and dementia brought to the emergency room for evaluation of increasing alteration in mental status. Shortly after arriving in the emergency room he had a generalized seizure. Patient also had a witnessed subsequent generalized seizure, and partial seizure activity during this evaluation. He was given IV Ativan and also charted on a loading dose of Keppra 1000 mg IV. He was given an additional 1 mg of Ativan for partial seizure activity prior to completion of IV Keppra load. No further seizure activity was seen. There's no previous history of seizures. Family indicated that he had become progressively more confused over several days. He has a history of urinary tract infections with increased confusion. Urinalysis today showed only of a small number of leukocytes. Microscopic exam was normal. Rectal temperature was 99.6. WBC count was 16.5 with 89% neutrophils. CT scan of his head showed no acute intracranial abnormality.  CT head imaging reviewed and overall unremarkable.   Objective: Current vital signs: BP 120/69 mmHg  Pulse 64  Temp(Src) 98.2 F (36.8 C) (Axillary)  Resp 21  Ht 5\' 9"  (1.753 m)  Wt 96.4 kg (212 lb 8.4 oz)  BMI 31.37 kg/m2  SpO2 96% Vital signs in last 24 hours: Temp:  [98.2 F (36.8 C)-100.4 F (38 C)] 98.2 F (36.8 C) (12/15 0400) Pulse Rate:  [56-147] 64 (12/15 0700) Resp:  [19-34] 21 (12/15 0700) BP: (120-168)/(51-110) 120/69 mmHg (12/15 0920) SpO2:  [88 %-98 %] 96 % (12/15 0700) Weight:  [90.719  kg (200 lb)-96.4 kg (212 lb 8.4 oz)] 96.4 kg (212 lb 8.4 oz) (12/15 0400)  Intake/Output from previous day: 12/14 0701 - 12/15 0700 In: 3358.3 [I.V.:2288.3; IV Piggyback:1070] Out: 715 [Urine:715] Intake/Output this shift: Total I/O In: 148.3 [I.V.:48.3; IV Piggyback:100] Out: -  Nutritional status: Diet NPO time specified Except for: Sips with Meds  Neurologic Exam: Eyes opened spontaneously and to verbal stimuli. Able to tell me his name, "hospital" and his daughters name. Follows simple comands Pupils were equal and reacted normally to light. No gaze preference noted; would not follow finger but appeared to briefly track examiner to right and left Face was symmetrical with no focal weakness. Muscle tone was flaccid throughout. Patient had a mild rest and moderate action/postural tremor bilateral UE. Moves all extremities symmetrically and against light resistance. There was no abnormal posturing. Deep tendon reflexes were trace to 1+ and symmetrical. Plantar responses were extensor bilaterally.  Lab Results: Basic Metabolic Panel:  Recent Labs Lab 09/12/14 0800 09/12/14 0822 09/13/14 0334 09/14/14 0345 09/14/14 0348  NA 150* 147 149*  --  145  K 3.8 3.6* 2.8*  --  3.2*  CL 95* 98 107  --  105  CO2 24  --  28  --  26  GLUCOSE 214* 210* 142*  --  164*  BUN 19 21 20   --  20  CREATININE 1.26 1.20 1.31  --  1.13  CALCIUM 8.1*  --  6.2*  --  6.6*  MG  --   --  0.4*  --  1.3*  PHOS  --   --   --  3.1  --     Liver Function Tests:  Recent Labs Lab 09/12/14 0800  AST 27  ALT 9  ALKPHOS 60  BILITOT 0.7  PROT 6.8  ALBUMIN 3.0*   No results for input(s): LIPASE, AMYLASE in the last 168 hours. No results for input(s): AMMONIA in the last 168 hours.  CBC:  Recent Labs Lab 09/12/14 0800 09/12/14 0822 09/13/14 0334 09/14/14 0348  WBC 16.5*  --  9.2 9.8  NEUTROABS 14.6*  --   --   --   HGB 11.4* 13.6 9.2* 9.3*  HCT 39.0 40.0 30.5* 31.3*  MCV 89.7  --  86.2  86.7  PLT 171  --  141* 140*    Cardiac Enzymes:  Recent Labs Lab 09/12/14 0800  TROPONINI 0.33*    Lipid Panel: No results for input(s): CHOL, TRIG, HDL, CHOLHDL, VLDL, LDLCALC in the last 168 hours.  CBG:  Recent Labs Lab 09/13/14 2052 09/13/14 2359 09/14/14 0415 09/14/14 0506 09/14/14 0801  GLUCAP 127* 148* 137* 132* 134*    Microbiology: Results for orders placed or performed during the hospital encounter of 09/12/14  Blood culture (routine x 2)     Status: None (Preliminary result)   Collection Time: 09/12/14  9:23 AM  Result Value Ref Range Status   Specimen Description BLOOD RIGHT ANTECUBITAL  Final   Special Requests BOTTLES DRAWN AEROBIC AND ANAEROBIC 5 CC EACH  Final   Culture  Setup Time   Final    09/12/2014 19:25 Performed at [facility]    Culture   Final           BLOOD CULTURE RECEIVED NO GROWTH TO DATE CULTURE WILL BE HELD FOR 5 DAYS BEFORE ISSUING A FINAL NEGATIVE REPORT Performed at [facility]    Report Status PENDING  Incomplete  Blood culture (routine x 2)     Status: None (Preliminary result)   Collection Time: 09/12/14  9:23 AM  Result Value Ref Range Status   Specimen Description BLOOD LEFT ANTECUBITAL  Final   Special Requests BOTTLES DRAWN AEROBIC AND ANAEROBIC 5 CC EACH  Final   Culture  Setup Time   Final    09/12/2014 19:25 Performed at [facility]    Culture   Final           BLOOD CULTURE RECEIVED NO GROWTH TO DATE CULTURE WILL BE HELD FOR 5 DAYS BEFORE ISSUING A FINAL NEGATIVE REPORT Performed at [facility]    Report Status PENDING  Incomplete  Urine culture     Status: None   Collection Time: 09/12/14 10:10 AM  Result Value Ref Range Status   Specimen Description URINE, CATHETERIZED  Final   Special Requests NONE  Final   Culture  Setup Time   Final    09/12/2014 15:54 Performed at [facility]    Colony Count NO GROWTH Performed at [facility]   Final   Culture  NO GROWTH Performed at [facility]   Final   Report Status 09/13/2014 FINAL  Final  MRSA PCR Screening     Status: None   Collection Time: 09/12/14  3:47 PM  Result Value Ref Range Status   MRSA by PCR NEGATIVE NEGATIVE Final    Comment:        The GeneXpert MRSA Assay (FDA approved for NASAL specimens only), is one component of a comprehensive MRSA colonization surveillance program. It is not intended to diagnose MRSA infection nor to guide  or monitor treatment for MRSA infections.     Coagulation Studies:  Recent Labs  09/12/14 0800  LABPROT 16.7*  INR 1.34    Imaging: Mr Brain Wo Contrast  09/13/2014   CLINICAL DATA:  Involuntary tremors. Altered mental status with dementia, Initial encounter. The exam was truncated prematurely due to low O2 saturation.  EXAM: MRI HEAD WITHOUT CONTRAST  TECHNIQUE: Multiplanar, multiecho pulse sequences of the brain and surrounding structures were obtained without intravenous contrast.  COMPARISON:  CT head 09/12/2014  FINDINGS: The patient was unable to remain motionless for the exam. Small or subtle lesions could be overlooked.  No restricted diffusion, or visible acute/chronic hemorrhage. Generalized atrophy. T2 and FLAIR hyperintensity of the periventricular and subcortical white matter, also affecting the brainstem, likely chronic microvascular ischemic change. Hydrocephalus ex vacuo. No extra-axial fluid. Grossly negative orbits and sinuses.  IMPRESSION: Truncated, motion degraded exam, demonstrating no definite acute intracranial findings. Good general agreement with yesterday CT scan.   Electronically Signed   By: Davonna BellingJohn  Curnes M.D.   On: 09/13/2014 10:49    Medications:  Scheduled: . ezetimibe  10 mg Oral q1800   And  . atorvastatin  20 mg Oral q1800  . calcium gluconate  2 g Intravenous Once  . diltiazem  120 mg Oral 3 times per day  . heparin  5,000 Units Subcutaneous 3 times per day  . insulin aspart  0-9 Units  Subcutaneous 6 times per day  . levETIRAcetam  500 mg Intravenous Q12H  . magnesium sulfate 1 - 4 g bolus IVPB  2 g Intravenous Once  . piperacillin-tazobactam (ZOSYN)  IV  3.375 g Intravenous Q8H  . potassium chloride  10 mEq Intravenous Q1 Hr x 4  . sodium chloride  3 mL Intravenous Q12H  . vancomycin  750 mg Intravenous Q12H    Assessment/Plan: 80 year old man with diabetes mellitus, COPD and hypertension as well as atrial fibrillation and dementia presenting with encephalopathic state with new onset seizure activity. MRI brain and EEG overall unremarkable. Unclear etiology of new onset seizure. Mental status has markedly improved. Suspect encephalopathy was likely multifactorial.   -metabolic corrections per primary team -no further inpatient neurological workup at this time. Please call with further questions -transition keppra to oral and continue 500mg  Q12hrs -suggest outpatient neurology follow up for further evaluation and treatment of tremor    LOS: 2 days   Elspeth Choeter Brandin Stetzer, DO [facility] [phone]  If 7pm- 7am, please page neurology on call as listed in [facility]. 09/14/2014  9:33 AM

## 2014-09-14 NOTE — Evaluation (Signed)
Clinical/Bedside Swallow Evaluation Patient Details  Name: Glen Miller MRN: [id number] Date of Birth: 1927/12/22  Today's Date: 09/14/2014 Time: 1236-1250 SLP Time Calculation (min) (ACUTE ONLY): 14 min  Past Medical History:  Past Medical History  Diagnosis Date  . Hypertension   . Diabetes mellitus   . COPD (chronic obstructive pulmonary disease)   . Dementia   . Atrial fibrillation    Past Surgical History:  Past Surgical History  Procedure Laterality Date  . Kidney stones    . Back surgery    . Exploration post operative open heart    . Triple bypass    . Cholecystectomy    . Knee arthroscopy    . Appendectomy    . Eye surgery    . Joint replacement     HPI:  Glen BoSamuel Yates is a 80 y.o. male, with past medical history of hypertension, diabetes mellitus, COPD, and atrial fibrillation not on anticoagulation, dementia, baseline bedridden/wheelchair dependent, verbal, brought by his daughters for altered mental status, family report patient has been more confused, lethargic, less responsive over the last 24 hours, which prompted them to bring him to ED, patient was noticed to have seizures, received loading dose of IV Keppra 1000 mg, and started on 500 mg IV twice a day as per neurology recommendation, seen by neurology service, with recommendation of MRI brain, continue , EEG. No further seizures since admission.   Assessment / Plan / Recommendation Clinical Impression  Pt demonstrates signs of a mild dysphagia with no evidence of aspiration over 12 oz intake of liquid via cup  and straw. Swallow is slightly delayed, and wife reports occasional episodes of coughing with liquids at home over the past 4 months contiguous with pts gradual decline and poor PO intake. Offered basic aspiration precautions and small sip strategy to pt and family. Recommend pt initiate a dys 2 (finely chopped) diet and thin liquids until dentures available with basic aspiration precautions. SLP will  f/u for further check of tolerance and solid food upgrade if possible. RN will report any observations of difficulty.     Aspiration Risk  Mild    Diet Recommendation Dysphagia 2 (Fine chop);Thin liquid   Liquid Administration via: Cup;Straw (small sips) Medication Administration: Whole meds with liquid Supervision: Full supervision/cueing for compensatory strategies Compensations: Slow rate;Small sips/bites Postural Changes and/or Swallow Maneuvers: Seated upright 90 degrees    Other  Recommendations Oral Care Recommendations: Oral care BID   Follow Up Recommendations  24 hour supervision/assistance    Frequency and Duration min 2x/week  1 week   Pertinent Vitals/Pain NA    SLP Swallow Goals     Swallow Study Prior Functional Status       General HPI: Glen Miller is a 80 y.o. male, with past medical history of hypertension, diabetes mellitus, COPD, and atrial fibrillation not on anticoagulation, dementia, baseline bedridden/wheelchair dependent, verbal, brought by his daughters for altered mental status, family report patient has been more confused, lethargic, less responsive over the last 24 hours, which prompted them to bring him to ED, patient was noticed to have seizures, received loading dose of IV Keppra 1000 mg, and started on 500 mg IV twice a day as per neurology recommendation, seen by neurology service, with recommendation of MRI brain, continue , EEG. No further seizures since admission. Type of Study: Bedside swallow evaluation Diet Prior to this Study: NPO Temperature Spikes Noted: No Respiratory Status: Nasal cannula History of Recent Intubation: No Behavior/Cognition: Alert;Cooperative;Pleasant mood Oral Cavity -  Dentition: Edentulous;Dentures, not available Self-Feeding Abilities: Needs assist Patient Positioning: Upright in bed Baseline Vocal Quality: Clear Volitional Cough: Strong Volitional Swallow: Able to elicit    Oral/Motor/Sensory Function  Overall Oral Motor/Sensory Function: Other (comment) (tremulous, otherwise WNL)   Ice Chips Ice chips: Within functional limits   Thin Liquid Thin Liquid: Impaired Presentation: Cup;Straw;Self Fed Pharyngeal  Phase Impairments: Suspected delayed Swallow    Nectar Thick Nectar Thick Liquid: Not tested   Honey Thick Honey Thick Liquid: Not tested   Puree Puree: Within functional limits   Solid   GO    Solid: Not tested (dentures not available)      Harlon DittyBonnie Ladawna Walgren, MA CCC-SLP [phone]  Cayce Quezada, Riley NearingBonnie Caroline 09/14/2014,12:54 PM

## 2014-09-14 NOTE — Progress Notes (Signed)
Patient Demographics  Glen Miller, is a 80 y.o. male, DOB - May 20, 1928, GUY:[id number]  Admit date - 09/12/2014   Admitting Physician Starleen Armsawood S Fredna Stricker, MD  Outpatient Primary MD for the patient is Georgann HousekeeperHUSAIN,KARRAR, MD  LOS - 2   Chief Complaint  Patient presents with  . Altered Mental Status      Admission history of present illness/brief narrative:  Glen Miller is a 80 y.o. male, with past medical history of hypertension, diabetes mellitus, COPD, and atrial fibrillation not on anticoagulation, dementia, baseline bedridden/wheelchair dependent, verbal, brought by his daughters for altered mental status, family report patient has been more confused, lethargic, less responsive over the last 24 hours, which prompted them to bring him to ED,  patient was noticed to have seizures, received loading dose of IV Keppra 1000 mg, and started on 500 mg IV twice a day as per neurology recommendation, seen by neurology service, with recommendation of MRI brain, continue , EEG. No further seizures since admission. workup was significant for pneumonia, most likely aspiration as family report coughing during eating, chest x-ray showing evidence of infiltrate/opacity, patient was started on IV vancomycin twice and and Zosyn . as well patient was noticed to have hypernatremia, he has known history of A. fib, was in A. fib with RVR requiring Cardizem drip initially. Patient is being transitioned to oral Cardizem, with the Toprol Cardizem drip and transferred to telemetry floor. Agent does have multiple electrolyte abnormalities, which does require aggressive replacement, loading hypokalemia, hypomagnesemia, hypocalcemia, patient had to runs of 2 beats of nonsustained V. Tach, this is most likely related to his tract abnormality, continue to monitor on telemetry. Patient had urinary retention  on first night, where he required Foley catheter.  Subjective:   Glen Miller had good night sleep , no complaints ,no further seizures, no events overnight.  Assessment & Plan    Active Problems:   Generalized seizures   Dementia   Chronic atrial fibrillation   Acute encephalopathy   PNA (pneumonia)   Hypernatremia  Seizures: - Etiology is unclear, and neurology consult appreciated, will obtain MRI brain  and EEG overall unremarkable. -  will transition IV Keppra to by mouth. -  seizure precautions. - Possibly multifactorial related to encephalopathy from hypernatremia, infection. - No recurrence since admission to stepdown.  Acute encephalopathy -Secondary to hypernatremia, infectious process, and seizures, CT head does not show any acute findings, MRI brain and EEG are nonrevealing. -Poor baseline mental status secondary to dementia. -Continues to improve , patient is communicative today.  Atrial fibrillation with RVR -Patient initially on IV Cardizem drip, started on by mouth Cardizem today, just turned Cardizem drip off, if remains stable and be transferred to telemetry.  Hypernatremia -Volume depletion and dehydration. -Continue with D5 half-normal saline. -Monitor closely. -Continue to improve gradually  Pneumonia -Likely aspiration pneumonia, weight SLP consult, continue with vancomycin and Zosyn 12/13.  Multiple electrolyte abnormalities including hypokalemia, hypocalcemia, hypomagnesemia. - We'll continue to replace, check level in a.m.  Couple beats of NSVT overnight: -This is most likely related to his significant electrolyte abnormalities including hypokalemia, hypomagnesemia, will continue to replace, continue on telemetry monitor. - Start on low-dose beta blocker, which will assist with active fibrillation as well.  DVT Prophylaxis Heparin  Code Status: DO NOT RESUSCITATE  Family Communication: Daughter at bedside  Disposition Plan: Remains in  stepdown, will transfer to telemetry if heart rate remains controlled off Cardizem drip.   Procedures  None   Consults  Neurology   Medications  Scheduled Meds: . ezetimibe  10 mg Oral q1800   And  . atorvastatin  20 mg Oral q1800  . diltiazem  120 mg Oral 3 times per day  . heparin  5,000 Units Subcutaneous 3 times per day  . insulin aspart  0-9 Units Subcutaneous 6 times per day  . levETIRAcetam  500 mg Intravenous Q12H  . magnesium sulfate 1 - 4 g bolus IVPB  2 g Intravenous Once  . piperacillin-tazobactam (ZOSYN)  IV  3.375 g Intravenous Q8H  . potassium chloride  10 mEq Intravenous Q1 Hr x 4  . sodium chloride  3 mL Intravenous Q12H  . vancomycin  750 mg Intravenous Q12H   Continuous Infusions: . diltiazem (CARDIZEM) infusion Stopped (09/14/14 1033)   PRN Meds:.acetaminophen **OR** acetaminophen, albuterol, LORazepam, nitroGLYCERIN, ondansetron **OR** ondansetron (ZOFRAN) IV, tiotropium  DVT Prophylaxis  Heparin -  Lab Results  Component Value Date   PLT 140* 09/14/2014    Antibiotics    Anti-infectives    Start     Dose/Rate Route Frequency Ordered Stop   09/13/14 0800  cefTRIAXone (ROCEPHIN) 1 g in dextrose 5 % 50 mL IVPB  Status:  Discontinued     1 g100 mL/hr over 30 Minutes Intravenous Every 24 hours 09/12/14 1122 09/12/14 1224   09/13/14 0800  azithromycin (ZITHROMAX) 500 mg in dextrose 5 % 250 mL IVPB  Status:  Discontinued     500 mg250 mL/hr over 60 Minutes Intravenous Every 24 hours 09/12/14 1122 09/12/14 1224   09/12/14 2200  piperacillin-tazobactam (ZOSYN) IVPB 3.375 g     3.375 g12.5 mL/hr over 240 Minutes Intravenous Every 8 hours 09/12/14 1228     09/12/14 1300  piperacillin-tazobactam (ZOSYN) IVPB 3.375 g     3.375 g100 mL/hr over 30 Minutes Intravenous  Once 09/12/14 1227 09/12/14 1622   09/12/14 1300  vancomycin (VANCOCIN) IVPB 750 mg/150 ml premix     750 mg150 mL/hr over 60 Minutes Intravenous Every 12 hours 09/12/14 1229     09/12/14 0930   cefTRIAXone (ROCEPHIN) 1 g in dextrose 5 % 50 mL IVPB     1 g100 mL/hr over 30 Minutes Intravenous  Once 09/12/14 0925 09/12/14 1051   09/12/14 0930  azithromycin (ZITHROMAX) 500 mg in dextrose 5 % 250 mL IVPB     500 mg250 mL/hr over 60 Minutes Intravenous  Once 09/12/14 0925 09/12/14 1122          Objective:   Filed Vitals:   09/14/14 0700 09/14/14 0800 09/14/14 0900 09/14/14 0920  BP: 168/73  120/69 120/69  Pulse: 64  64   Temp:  98.9 F (37.2 C)    TempSrc:  Axillary    Resp: 21  23   Height:      Weight:      SpO2: 96%  97%     Wt Readings from Last 3 Encounters:  09/14/14 96.4 kg (212 lb 8.4 oz)  09/13/14 90.719 kg (200 lb)  07/31/13 104.327 kg (230 lb)     Intake/Output Summary (Last 24 hours) at 09/14/14 1140 Last data filed at 09/14/14 1009  Gross per 24 hour  Intake 3118.33 ml  Output    715 ml  Net 2403.33 ml  Physical Exam  Appears more awake today, open eyes, answers simple questions. [place].AT,PERRAL Supple Neck,No JVD, No cervical lymphadenopathy appriciated.  Symmetrical Chest wall movement, Good air movement bilaterally, no wheezing Irregular irregular,No Gallops,Rubs or new Murmurs, No Parasternal Heave +ve B.Sounds, Abd Soft, No tenderness, No organomegaly appriciated, No rebound - guarding or rigidity. No Cyanosis, Clubbing or edema, No new Rash or bruise     Data Review   Micro Results Recent Results (from the past 240 hour(s))  Blood culture (routine x 2)     Status: None (Preliminary result)   Collection Time: 09/12/14  9:23 AM  Result Value Ref Range Status   Specimen Description BLOOD RIGHT ANTECUBITAL  Final   Special Requests BOTTLES DRAWN AEROBIC AND ANAEROBIC 5 CC EACH  Final   Culture  Setup Time   Final    09/12/2014 19:25 Performed at [facility]    Culture   Final           BLOOD CULTURE RECEIVED NO GROWTH TO DATE CULTURE WILL BE HELD FOR 5 DAYS BEFORE ISSUING A FINAL NEGATIVE REPORT Performed at [facility]    Report Status PENDING  Incomplete  Blood culture (routine x 2)     Status: None (Preliminary result)   Collection Time: 09/12/14  9:23 AM  Result Value Ref Range Status   Specimen Description BLOOD LEFT ANTECUBITAL  Final   Special Requests BOTTLES DRAWN AEROBIC AND ANAEROBIC 5 CC EACH  Final   Culture  Setup Time   Final    09/12/2014 19:25 Performed at [facility]    Culture   Final           BLOOD CULTURE RECEIVED NO GROWTH TO DATE CULTURE WILL BE HELD FOR 5 DAYS BEFORE ISSUING A FINAL NEGATIVE REPORT Performed at [facility]    Report Status PENDING  Incomplete  Urine culture     Status: None   Collection Time: 09/12/14 10:10 AM  Result Value Ref Range Status   Specimen Description URINE, CATHETERIZED  Final   Special Requests NONE  Final   Culture  Setup Time   Final    09/12/2014 15:54 Performed at [facility]    Colony Count NO GROWTH Performed at [facility]   Final   Culture NO GROWTH Performed at [facility]   Final   Report Status 09/13/2014 FINAL  Final  MRSA PCR Screening     Status: None   Collection Time: 09/12/14  3:47 PM  Result Value Ref Range Status   MRSA by PCR NEGATIVE NEGATIVE Final    Comment:        The GeneXpert MRSA Assay (FDA approved for NASAL specimens only), is one component of a comprehensive MRSA colonization surveillance program. It is not intended to diagnose MRSA infection nor to guide or monitor treatment for MRSA infections.     Radiology Reports Mr Brain Wo Contrast  09/13/2014   CLINICAL DATA:  Involuntary tremors. Altered mental status with dementia, Initial encounter. The exam was truncated prematurely due to low O2 saturation.  EXAM: MRI HEAD WITHOUT CONTRAST  TECHNIQUE: Multiplanar, multiecho pulse sequences of the brain and surrounding structures were obtained without intravenous contrast.  COMPARISON:  CT head 09/12/2014  FINDINGS: The patient was unable to  remain motionless for the exam. Small or subtle lesions could be overlooked.  No restricted diffusion, or visible acute/chronic hemorrhage. Generalized atrophy. T2 and FLAIR hyperintensity of the periventricular and subcortical white  matter, also affecting the brainstem, likely chronic microvascular ischemic change. Hydrocephalus ex vacuo. No extra-axial fluid. Grossly negative orbits and sinuses.  IMPRESSION: Truncated, motion degraded exam, demonstrating no definite acute intracranial findings. Good general agreement with yesterday CT scan.   Electronically Signed   By: Davonna Belling M.D.   On: 09/13/2014 10:49    CBC  Recent Labs Lab 09/12/14 0800 09/12/14 0822 09/13/14 0334 09/14/14 0348  WBC 16.5*  --  9.2 9.8  HGB 11.4* 13.6 9.2* 9.3*  HCT 39.0 40.0 30.5* 31.3*  PLT 171  --  141* 140*  MCV 89.7  --  86.2 86.7  MCH 26.2  --  26.0 25.8*  MCHC 29.2*  --  30.2 29.7*  RDW 17.8*  --  17.9* 17.9*  LYMPHSABS 1.0  --   --   --   MONOABS 0.9  --   --   --   EOSABS 0.0  --   --   --   BASOSABS 0.0  --   --   --     Chemistries   Recent Labs Lab 09/12/14 0800 09/12/14 0822 09/13/14 0334 09/14/14 0348  NA 150* 147 149* 145  K 3.8 3.6* 2.8* 3.2*  CL 95* 98 107 105  CO2 24  --  28 26  GLUCOSE 214* 210* 142* 164*  BUN 19 21 20 20   CREATININE 1.26 1.20 1.31 1.13  CALCIUM 8.1*  --  6.2* 6.6*  MG  --   --  0.4* 1.3*  AST 27  --   --   --   ALT 9  --   --   --   ALKPHOS 60  --   --   --   BILITOT 0.7  --   --   --    ------------------------------------------------------------------------------------------------------------------ estimated creatinine clearance is 55.8 mL/min (by C-G formula based on Cr of 1.13). ------------------------------------------------------------------------------------------------------------------ No results for input(s): HGBA1C in the last 72  hours. ------------------------------------------------------------------------------------------------------------------ No results for input(s): CHOL, HDL, LDLCALC, TRIG, CHOLHDL, LDLDIRECT in the last 72 hours. ------------------------------------------------------------------------------------------------------------------ No results for input(s): TSH, T4TOTAL, T3FREE, THYROIDAB in the last 72 hours.  Invalid input(s): FREET3 ------------------------------------------------------------------------------------------------------------------ No results for input(s): VITAMINB12, FOLATE, FERRITIN, TIBC, IRON, RETICCTPCT in the last 72 hours.  Coagulation profile  Recent Labs Lab 09/12/14 0800  INR 1.34    No results for input(s): DDIMER in the last 72 hours.  Cardiac Enzymes  Recent Labs Lab 09/12/14 0800  TROPONINI 0.33*   ------------------------------------------------------------------------------------------------------------------ Invalid input(s): POCBNP     Time Spent in minutes   35 minutes   Janan Bogie M.D on 09/14/2014 at 11:40 AM  Between 7am to 7pm - Pager - [phone]  After 7pm go to [email or web address] - password [email or web address]  And look for the night coverage person covering for me after hours  [facility] Office  [phone]   **Disclaimer: This note may have been dictated with voice recognition software. Similar sounding words can inadvertently be transcribed and this note may contain transcription errors which may not have been corrected upon publication of note.**

## 2014-09-15 ENCOUNTER — Inpatient Hospital Stay (HOSPITAL_COMMUNITY): Payer: Medicare Other

## 2014-09-15 ENCOUNTER — Encounter (HOSPITAL_COMMUNITY): Payer: Self-pay | Admitting: Radiology

## 2014-09-15 DIAGNOSIS — R310 Gross hematuria: Secondary | ICD-10-CM

## 2014-09-15 DIAGNOSIS — J189 Pneumonia, unspecified organism: Secondary | ICD-10-CM | POA: Diagnosis present

## 2014-09-15 DIAGNOSIS — D62 Acute posthemorrhagic anemia: Secondary | ICD-10-CM | POA: Clinically undetermined

## 2014-09-15 DIAGNOSIS — E876 Hypokalemia: Secondary | ICD-10-CM

## 2014-09-15 DIAGNOSIS — D649 Anemia, unspecified: Secondary | ICD-10-CM | POA: Diagnosis present

## 2014-09-15 LAB — COMPREHENSIVE METABOLIC PANEL
ALT: 8 U/L (ref 0–53)
AST: 15 U/L (ref 0–37)
Albumin: 2.1 g/dL — ABNORMAL LOW (ref 3.5–5.2)
Alkaline Phosphatase: 42 U/L (ref 39–117)
Anion gap: 11 (ref 5–15)
BUN: 16 mg/dL (ref 6–23)
CALCIUM: 7.1 mg/dL — AB (ref 8.4–10.5)
CO2: 28 meq/L (ref 19–32)
CREATININE: 0.98 mg/dL (ref 0.50–1.35)
Chloride: 105 mEq/L (ref 96–112)
GFR calc Af Amer: 85 mL/min — ABNORMAL LOW (ref >90)
GFR, EST NON AFRICAN AMERICAN: 73 mL/min — AB (ref >90)
Glucose, Bld: 178 mg/dL — ABNORMAL HIGH (ref 70–99)
Potassium: 3.5 mEq/L — ABNORMAL LOW (ref 3.7–5.3)
Sodium: 144 mEq/L (ref 137–147)
TOTAL PROTEIN: 5.1 g/dL — AB (ref 6.0–8.3)
Total Bilirubin: 0.6 mg/dL (ref 0.3–1.2)

## 2014-09-15 LAB — RETICULOCYTES
RBC.: 3.46 MIL/uL — AB (ref 4.22–5.81)
RETIC CT PCT: 1.6 % (ref 0.4–3.1)
Retic Count, Absolute: 55.4 10*3/uL (ref 19.0–186.0)

## 2014-09-15 LAB — CBC
HCT: 30 % — ABNORMAL LOW (ref 39.0–52.0)
HCT: 33.6 % — ABNORMAL LOW (ref 39.0–52.0)
HEMOGLOBIN: 9.9 g/dL — AB (ref 13.0–17.0)
Hemoglobin: 8.9 g/dL — ABNORMAL LOW (ref 13.0–17.0)
MCH: 25.8 pg — AB (ref 26.0–34.0)
MCH: 25.8 pg — ABNORMAL LOW (ref 26.0–34.0)
MCHC: 29.7 g/dL — ABNORMAL LOW (ref 30.0–36.0)
MCHC: 29.5 g/dL — ABNORMAL LOW (ref 30.0–36.0)
MCV: 87 fL (ref 78.0–100.0)
MCV: 87.7 fL (ref 78.0–100.0)
PLATELETS: 152 10*3/uL (ref 150–400)
Platelets: 171 10*3/uL (ref 150–400)
RBC: 3.45 MIL/uL — AB (ref 4.22–5.81)
RBC: 3.83 MIL/uL — ABNORMAL LOW (ref 4.22–5.81)
RDW: 17.7 % — ABNORMAL HIGH (ref 11.5–15.5)
RDW: 18 % — ABNORMAL HIGH (ref 11.5–15.5)
WBC: 8.8 10*3/uL (ref 4.0–10.5)
WBC: 6.8 10*3/uL (ref 4.0–10.5)

## 2014-09-15 LAB — URINALYSIS, ROUTINE W REFLEX MICROSCOPIC
Glucose, UA: NEGATIVE mg/dL
Ketones, ur: 40 mg/dL — AB
Nitrite: NEGATIVE
PROTEIN: 100 mg/dL — AB
Specific Gravity, Urine: 1.028 (ref 1.005–1.030)
UROBILINOGEN UA: 0.2 mg/dL (ref 0.0–1.0)
pH: 6 (ref 5.0–8.0)

## 2014-09-15 LAB — GLUCOSE, CAPILLARY
GLUCOSE-CAPILLARY: 153 mg/dL — AB (ref 70–99)
GLUCOSE-CAPILLARY: 150 mg/dL — AB (ref 70–99)
Glucose-Capillary: 225 mg/dL — ABNORMAL HIGH (ref 70–99)
Glucose-Capillary: 362 mg/dL — ABNORMAL HIGH (ref 70–99)
Glucose-Capillary: 196 mg/dL — ABNORMAL HIGH (ref 70–99)

## 2014-09-15 LAB — MAGNESIUM: MAGNESIUM: 1.7 mg/dL (ref 1.5–2.5)

## 2014-09-15 LAB — IRON AND TIBC
Iron: 34 ug/dL — ABNORMAL LOW (ref 42–135)
Saturation Ratios: 13 % — ABNORMAL LOW (ref 20–55)
TIBC: 261 ug/dL (ref 215–435)
UIBC: 227 ug/dL (ref 125–400)

## 2014-09-15 LAB — FERRITIN: FERRITIN: 80 ng/mL (ref 22–322)

## 2014-09-15 LAB — FOLATE: Folate: 5.3 ng/mL

## 2014-09-15 LAB — URINE MICROSCOPIC-ADD ON

## 2014-09-15 LAB — VITAMIN B12: VITAMIN B 12: 1515 pg/mL — AB (ref 211–911)

## 2014-09-15 MED ORDER — DILTIAZEM HCL ER COATED BEADS 120 MG PO CP24
240.0000 mg | ORAL_CAPSULE | Freq: Once | ORAL | Status: AC
  Administered 2014-09-15: 240 mg via ORAL
  Filled 2014-09-15: qty 2

## 2014-09-15 MED ORDER — INSULIN GLARGINE 100 UNIT/ML ~~LOC~~ SOLN
20.0000 [IU] | Freq: Every day | SUBCUTANEOUS | Status: DC
  Administered 2014-09-15 – 2014-09-19 (×5): 20 [IU] via SUBCUTANEOUS
  Filled 2014-09-15 (×6): qty 0.2

## 2014-09-15 MED ORDER — INSULIN ASPART 100 UNIT/ML ~~LOC~~ SOLN
0.0000 [IU] | Freq: Three times a day (TID) | SUBCUTANEOUS | Status: DC
  Administered 2014-09-16: 2 [IU] via SUBCUTANEOUS
  Administered 2014-09-16: 3 [IU] via SUBCUTANEOUS
  Administered 2014-09-16 – 2014-09-17 (×2): 2 [IU] via SUBCUTANEOUS
  Administered 2014-09-17: 3 [IU] via SUBCUTANEOUS
  Administered 2014-09-17: 2 [IU] via SUBCUTANEOUS
  Administered 2014-09-18 (×2): 1 [IU] via SUBCUTANEOUS
  Administered 2014-09-18 – 2014-09-20 (×5): 2 [IU] via SUBCUTANEOUS

## 2014-09-15 MED ORDER — POTASSIUM CHLORIDE CRYS ER 20 MEQ PO TBCR
40.0000 meq | EXTENDED_RELEASE_TABLET | Freq: Once | ORAL | Status: AC
  Administered 2014-09-15: 40 meq via ORAL
  Filled 2014-09-15: qty 2

## 2014-09-15 MED ORDER — MAGNESIUM SULFATE 50 % IJ SOLN
3.0000 g | Freq: Once | INTRAVENOUS | Status: AC
  Administered 2014-09-15: 3 g via INTRAVENOUS
  Filled 2014-09-15: qty 6

## 2014-09-15 MED ORDER — DILTIAZEM HCL ER COATED BEADS 180 MG PO CP24
360.0000 mg | ORAL_CAPSULE | Freq: Every day | ORAL | Status: DC
  Administered 2014-09-16 – 2014-09-20 (×5): 360 mg via ORAL
  Filled 2014-09-15 (×5): qty 2

## 2014-09-15 MED ORDER — BOOST PLUS PO LIQD
237.0000 mL | Freq: Two times a day (BID) | ORAL | Status: DC
  Filled 2014-09-15: qty 237

## 2014-09-15 MED ORDER — GLUCERNA SHAKE PO LIQD
237.0000 mL | Freq: Two times a day (BID) | ORAL | Status: DC
  Administered 2014-09-16 – 2014-09-20 (×9): 237 mL via ORAL
  Filled 2014-09-15 (×10): qty 237

## 2014-09-15 NOTE — Progress Notes (Signed)
Speech Language Pathology Treatment: Dysphagia  Patient Details Name: Glen Miller MRN: [id number] DOB: 1928/01/03 Today's Date: 09/15/2014 Time: 5974-7185 SLP Time Calculation (min) (ACUTE ONLY): 30 min  Assessment / Plan / Recommendation Clinical Impression  Pt currently with dentures in room and family at bedside.  Daughter reports feeding pt breakfast with good intake and good tolerance.  Cough x1 noted per daughter with liquids.    SLP assisted pt to consume water, applesauce and crackers after assisted with denture placement.  Slow but effective mastication noted.  Cough noted which may be concerning for aspiration with water via straw- better tolerance via cup.    Spouse reports pt does not cough when eating at home but does cough in the middle of the night - he has h/o GERD per daughter.    Educated pt/spouse/daughter to diet advancement recommendations, compensation strategies to migitate aspiration risk.  Using teach back, all education completed.  As pt has dementia, he will not recall strategies and full supervision is recommended. SLP to sign off, please reorder if indicated.      HPI HPI: Glen Miller is a 80 y.o. male, with past medical history of hypertension, diabetes mellitus, COPD, and atrial fibrillation not on anticoagulation, dementia, baseline bedridden/wheelchair dependent, verbal, brought by his daughters for altered mental status, family report patient has been more confused, lethargic, less responsive over the last 24 hours, which prompted them to bring him to ED, patient was noticed to have seizures, received loading dose of IV Keppra 1000 mg, and started on 500 mg IV twice a day as per neurology recommendation, seen by neurology service, with recommendation of MRI brain, continue , EEG. No further seizures since admission.   Pertinent Vitals Pain Assessment: No/denies pain  SLP Plan  All goals met    Recommendations Diet recommendations: Dysphagia 3  (mechanical soft);Thin liquid Liquids provided via: Cup (avoid straw if problematic) Medication Administration: Whole meds with liquid Supervision: Full supervision/cueing for compensatory strategies Compensations: Slow rate;Small sips/bites Postural Changes and/or Swallow Maneuvers: Seated upright 90 degrees              Oral Care Recommendations: Oral care BID Follow up Recommendations: 24 hour supervision/assistance Plan: All goals met    [employer], [place] [facility] SLP [phone]

## 2014-09-15 NOTE — Progress Notes (Signed)
NUTRITION FOLLOW UP  Intervention:   -Recommend Glucerna shakes BID, each supplement provides 220 kcal, 10 gram protein -Diet textures per SLP -RD to continue to monitor  Nutrition Dx:   Inadequate oral intake related to inability to eat as evidenced by NPO status; improving with diet advancement  Goal:   Pt to meet >/= 90% of their estimated nutrition needs; progressing  Monitor:   Total protein/energy intake, labs, weights, swallow profile  Assessment:   12/14: MD noted pt with possible aspiration PNA as pt with coughing during eating. -SLP attempted to evaluate on 12/14; however pt too lethargic and plan to re-attempt once pt more alert. Continues with NPO status -Pt unable to provide additional food/nutr hx. Denied changes in appetite or weight per RN nutrition screen. Will monitor diet advancement/appropriate textures and place nutritional supplement recommendations as warranted -Weight hx indicates pt with 25-30 lb weight loss over approximately one year (11% body weight loss, non-severe for time frame). Likely related to advancing age and progressive diseases (dementia, COPD, PNA) -K Low; being repleted IVF -Mg Low; recommend replacement  12/16: -SLP eval on 12/15 recommended pt comply with Dys2/thin diet -Daughter reported pt tolerating diet well, consuming ~50% of meals. SLP was working with pt towards advancing diet to Dys3 during time of RD follow up -Family in room confirmed poor PO intake for past 6 weeks. Diet recall indicated pt consuming Boost 1-2 times daily, and would consumes ~50% of 2-3 small meals. Denied nausea or abd pain. -Denied weight loss, usual body weight around 205 lb; wt currently elevated likely d/t fluid accumulation -Mg /K WNL -CBG's elevated; will modify to Glucerna shakes for assistance with blood glucose control  Height: Ht Readings from Last 1 Encounters:  09/12/14 5\' 9"  (1.753 m)    Weight Status:   Wt Readings from Last 1 Encounters:   09/15/14 215 lb 9.8 oz (97.8 kg)    Re-estimated needs:  Kcal: 1700-1900 Protein: 90-100 gram Fluid: >/= 1900 ml daily  Skin: +1 generalized edema, +2 LUE edema, +2 LLE edema  Diet Order: DIET DYS 3   Intake/Output Summary (Last 24 hours) at 09/15/14 1422 Last data filed at 09/15/14 1300  Gross per 24 hour  Intake    800 ml  Output    705 ml  Net     95 ml    Last BM: PTA   Labs:   Recent Labs Lab 09/13/14 0334 09/14/14 0345 09/14/14 0348 09/15/14 0340  NA 149*  --  145 144  K 2.8*  --  3.2* 3.5*  CL 107  --  105 105  CO2 28  --  26 28  BUN 20  --  20 16  CREATININE 1.31  --  1.13 0.98  CALCIUM 6.2*  --  6.6* 7.1*  MG 0.4*  --  1.3* 1.7  PHOS  --  3.1  --   --   GLUCOSE 142*  --  164* 178*    CBG (last 3)   Recent Labs  09/15/14 0352 09/15/14 0827 09/15/14 1158  GLUCAP 153* 150* 225*    Scheduled Meds: . ezetimibe  10 mg Oral q1800   And  . atorvastatin  20 mg Oral q1800  . diltiazem  240 mg Oral Once  . [START ON 09/16/2014] diltiazem  360 mg Oral Daily  . insulin aspart  0-9 Units Subcutaneous 6 times per day  . lactose free nutrition  237 mL Oral BID BM  . levETIRAcetam  500 mg  Oral BID  . metoprolol tartrate  12.5 mg Oral BID  . piperacillin-tazobactam (ZOSYN)  IV  3.375 g Intravenous Q8H  . sodium chloride  3 mL Intravenous Q12H    Continuous Infusions: . sodium chloride 10 mL/hr at 09/15/14 0101    Lloyd HugerSarah F Brenon Antosh MS RD LDN Clinical Dietitian Pager:[phone]

## 2014-09-15 NOTE — Progress Notes (Signed)
[facility] PROGRESS NOTE  MAVEN VARELAS BJY:[id number] DOB: 24-Sep-1928 DOA: 09/12/2014 PCP: Georgann Housekeeper, MD  Admission history of present illness/brief narrative:  Glen Miller is a 80 y.o. male, with past medical history of hypertension, diabetes mellitus, COPD, and atrial fibrillation not on anticoagulation, dementia, baseline bedridden/wheelchair dependent, verbal, brought by his daughters for altered mental status, family report patient has been more confused, lethargic, less responsive over the last 24 hours, which prompted them to bring him to ED, patient was noticed to have seizures, received loading dose of IV Keppra 1000 mg, and started on 500 mg IV twice a day as per neurology recommendation, seen by neurology service, with recommendation of MRI brain, continue , EEG. No further seizures since admission. workup was significant for pneumonia, most likely aspiration as family report coughing during eating, chest x-ray showing evidence of infiltrate/opacity, patient was started on IV vancomycin twice and and Zosyn . as well patient was noticed to have hypernatremia, he has known history of A. fib, was in A. fib with RVR requiring Cardizem drip initially. Patient is being transitioned to oral Cardizem, with the Toprol Cardizem drip and transferred to telemetry floor. Agent does have multiple electrolyte abnormalities, which does require aggressive replacement, loading hypokalemia, hypomagnesemia, hypocalcemia, patient had to runs of 2 beats of nonsustained V. Tach, this is most likely related to his tract abnormality, continue to monitor on telemetry. Patient had urinary retention on first night, where he required Foley catheter.     Assessment/Plan: #1 generalized seizures Unknown etiology. Abnormal electrolytes may have played a role. CT head negative. MRI head negative. EEG negative. Patient with no further seizures. Currently on oral Keppra. Continue seizure precautions.  Continue to replete electrolytes. Will need outpatient follow-up with neurology.  #2 acute encephalopathy Likely multifactorial in nature secondary to generalized seizures, hypernatremia, hypomagnesium, hypokalemia, infectious etiology of probable aspiration pneumonia in the setting of dementia. Clinical improvement. Continue to replete electrolytes. Continue Keppra. Continue empiric IV antibiotics. Follow.  #3 A. Fib with RVR Patient is no longer on the Cardizem drip. Patient's heart rate has remained stable.change Cardizem to 360 mg daily. Continue metoprolol. Patient currently with hematuria and also a history of dementialikely a poor candidate for anticoagulation. Follow.  #4 pneumonia/probable aspiration pneumonia Blood cultures with no growth to date. Afebrile.clinical improvement. Continue IV Zosyn. Discontinue IV vancomycin. Follow.  #5 hypernatremia Secondary to volume depletion and dehydration. Improvement with D5 half-normal saline. Follow.  #6 hypernatremia/hypokalemia/hypomagnesemia/hypocalcemia Improving. Hyponatremia has improved.hypokalemia with some improvement however we'll continue to replete. Keep magnesium greater than 2 and a such will give a dose of IV magnesium. Corrected calcium is 8.6. Follow.  #7 hematuria Questionable etiology. Patient with a history of kidney stones per daughter. Flush Foley catheter. Will get a CT of the abdomen and pelvis stone protocol. Follow. If no improvement with hematuria will need to consult with urology. Follow.  #8 anemia/acute blood loss anemia Likely secondary to hematuria. Check an anemia panel. Hemoglobin currently at 8.9 from 9.3 yesterday, from 13.6 on 09/12/2014. FOBT. CT of the abdomen and pelvis for stone protocol. Will check a CBC this afternoon. Transfuse for hemoglobin less than 8. Follow.  #9 nonsustained V. Tach Likely secondary to electrolyte abnormalities of hypokalemia and hypomagnesemia. No further events overnight.  Continue to replete electrolytes. Continue low-dose beta blocker.  #10 prophylaxis Heparin for DVT prophylaxis.    Code Status: DO NOT RESUSCITATE Family Communication: updated patient and daughter at bedside. Disposition Plan: remain in step unit.   Consultants:  Neurology: Dr.Dr. Roseanne RenoStewart 09/12/2014  Procedures:  EEG 09/13/2014  Chest x-ray 09/12/2014  CT head 09/12/2014  MRI head 09/13/2014  Antibiotics:  IV Zosyn 09/12/2014  IV vancomycin 09/12/2014  IV azithromycin 09/12/2014>>>09/13/2014  IV Rocephin 09/12/2014>>> 09/13/2014  HPI/Subjective: Patient more alert. Patient following commands. Patient's mental status is significantly improved. Patient with hematuria noted per nursing ongoing for at least the past 24 hours.  Objective: Filed Vitals:   09/15/14 1000  BP: 150/65  Pulse: 62  Temp:   Resp: 21    Intake/Output Summary (Last 24 hours) at 09/15/14 1039 Last data filed at 09/15/14 1000  Gross per 24 hour  Intake    970 ml  Output    815 ml  Net    155 ml   Filed Weights   09/12/14 1453 09/14/14 0400 09/15/14 0400  Weight: 92.6 kg (204 lb 2.3 oz) 96.4 kg (212 lb 8.4 oz) 97.8 kg (215 lb 9.8 oz)    Exam:   General:  NAD  Cardiovascular: RRR  Respiratory: CTAB anterior lung fields.  Abdomen: soft, nontender, nondistended, positive bowel sounds.  Musculoskeletal: no clubbing cyanosis or edema.  Data Reviewed: Basic Metabolic Panel:  Recent Labs Lab 09/12/14 0800 09/12/14 0822 09/13/14 0334 09/14/14 0345 09/14/14 0348 09/15/14 0340  NA 150* 147 149*  --  145 144  K 3.8 3.6* 2.8*  --  3.2* 3.5*  CL 95* 98 107  --  105 105  CO2 24  --  28  --  26 28  GLUCOSE 214* 210* 142*  --  164* 178*  BUN 19 21 20   --  20 16  CREATININE 1.26 1.20 1.31  --  1.13 0.98  CALCIUM 8.1*  --  6.2*  --  6.6* 7.1*  MG  --   --  0.4*  --  1.3* 1.7  PHOS  --   --   --  3.1  --   --    Liver Function Tests:  Recent Labs Lab 09/12/14 0800  09/15/14 0340  AST 27 15  ALT 9 8  ALKPHOS 60 42  BILITOT 0.7 0.6  PROT 6.8 5.1*  ALBUMIN 3.0* 2.1*   No results for input(s): LIPASE, AMYLASE in the last 168 hours. No results for input(s): AMMONIA in the last 168 hours. CBC:  Recent Labs Lab 09/12/14 0800 09/12/14 0822 09/13/14 0334 09/14/14 0348 09/15/14 0340  WBC 16.5*  --  9.2 9.8 8.8  NEUTROABS 14.6*  --   --   --   --   HGB 11.4* 13.6 9.2* 9.3* 8.9*  HCT 39.0 40.0 30.5* 31.3* 30.0*  MCV 89.7  --  86.2 86.7 87.0  PLT 171  --  141* 140* 152   Cardiac Enzymes:  Recent Labs Lab 09/12/14 0800  TROPONINI 0.33*   BNP (last 3 results) No results for input(s): PROBNP in the last 8760 hours. CBG:  Recent Labs Lab 09/14/14 1552 09/14/14 1958 09/14/14 2320 09/15/14 0352 09/15/14 0827  GLUCAP 143* 176* 119* 153* 150*    Recent Results (from the past 240 hour(s))  Blood culture (routine x 2)     Status: None (Preliminary result)   Collection Time: 09/12/14  9:23 AM  Result Value Ref Range Status   Specimen Description BLOOD RIGHT ANTECUBITAL  Final   Special Requests BOTTLES DRAWN AEROBIC AND ANAEROBIC 5 CC EACH  Final   Culture  Setup Time   Final    09/12/2014 19:25 [facility]formed at [facility]  Culture   Final           BLOOD CULTURE RECEIVED NO GROWTH TO DATE CULTURE WILL BE HELD FOR 5 DAYS BEFORE ISSUING A FINAL NEGATIVE REPORT Performed at [facility]    Report Status PENDING  Incomplete  Blood culture (routine x 2)     Status: None (Preliminary result)   Collection Time: 09/12/14  9:23 AM  Result Value Ref Range Status   Specimen Description BLOOD LEFT ANTECUBITAL  Final   Special Requests BOTTLES DRAWN AEROBIC AND ANAEROBIC 5 CC EACH  Final   Culture  Setup Time   Final    09/12/2014 19:25 Performed at [facility]    Culture   Final           BLOOD CULTURE RECEIVED NO GROWTH TO DATE CULTURE WILL BE HELD FOR 5 DAYS BEFORE ISSUING A FINAL NEGATIVE REPORT Performed at  [facility]    Report Status PENDING  Incomplete  Urine culture     Status: None   Collection Time: 09/12/14 10:10 AM  Result Value Ref Range Status   Specimen Description URINE, CATHETERIZED  Final   Special Requests NONE  Final   Culture  Setup Time   Final    09/12/2014 15:54 Performed at [facility]    Colony Count NO GROWTH Performed at [facility]   Final   Culture NO GROWTH Performed at [facility]   Final   Report Status 09/13/2014 FINAL  Final  MRSA PCR Screening     Status: None   Collection Time: 09/12/14  3:47 PM  Result Value Ref Range Status   MRSA by PCR NEGATIVE NEGATIVE Final    Comment:        The GeneXpert MRSA Assay (FDA approved for NASAL specimens only), is one component of a comprehensive MRSA colonization surveillance program. It is not intended to diagnose MRSA infection nor to guide or monitor treatment for MRSA infections.      Studies: No results found.  Scheduled Meds: . ezetimibe  10 mg Oral q1800   And  . atorvastatin  20 mg Oral q1800  . diltiazem  120 mg Oral 3 times per day  . heparin  5,000 Units Subcutaneous 3 times per day  . insulin aspart  0-9 Units Subcutaneous 6 times per day  . levETIRAcetam  500 mg Oral BID  . magnesium sulfate 1 - 4 g bolus IVPB  3 g Intravenous Once  . metoprolol tartrate  12.5 mg Oral BID  . piperacillin-tazobactam (ZOSYN)  IV  3.375 g Intravenous Q8H  . sodium chloride  3 mL Intravenous Q12H  . vancomycin  1,000 mg Intravenous Q12H   Continuous Infusions: . sodium chloride 10 mL/hr at 09/15/14 0101    Principal Problem:   Generalized seizures Active Problems:   Dementia   Chronic atrial fibrillation   Acute encephalopathy   PNA (pneumonia)   Hypernatremia   Hematuria, gross   Hypokalemia   Hypomagnesemia   Anemia   Acute blood loss anemia    Time spent: 40 minutes    THOMPSON,DANIEL M.D. [facility] Pager [phone]. If 7PM-7AM, please  contact night-coverage at [email or web address], password [email or web address] 09/15/2014, 10:39 AM  LOS: 3 days

## 2014-09-15 NOTE — Consult Note (Signed)
Urology Consult  Consulting MD: Dr. Ramiro Harvest  CC: Blood in urine  HPI: This is a 80 year old male with multiple medical issues, recently admitted for new onset seizures. He has hypertension, diabetes mellitus, COPD, dementia, atrial fibrillation, and possible aspiration pneumonia. He and his family deny any history of gross hematuria or significant urinary issues of the past few years. There is a remote history of kidney stones. He's had no recent flank or abdominal pain. A Foley catheter was placed recently for urinary retention-350 mL of urine was obtained. Since catheter placement, he has had gross hematuria, first with clots, now clearing somewhat. He states the catheter placement was not painful, but at the time of catheter placement, there was a chance that he was not totally alert.  CT abdomen and pelvis was performed without IV contrast. This revealed atrophic kidneys with perinephric inflammation bilaterally, stable over the past 7 years since his last CT scan. There is no evidence of calculi in the kidney or along the course of either ureter. A Foley catheter is present within the bladder. There is perhaps a small stone within the bladder.  Urologic consult is requested  PMH: Past Medical History  Diagnosis Date  . Hypertension   . Diabetes mellitus   . COPD (chronic obstructive pulmonary disease)   . Dementia   . Atrial fibrillation     PSH: Past Surgical History  Procedure Laterality Date  . Kidney stones    . Back surgery    . Exploration post operative open heart    . Triple bypass    . Cholecystectomy    . Knee arthroscopy    . Appendectomy    . Eye surgery    . Joint replacement      Allergies: No Known Allergies  Medications: Prescriptions prior to admission  Medication Sig Dispense Refill Last Dose  . acetaminophen (TYLENOL) 500 MG tablet Take 1,000 mg by mouth every 6 (six) hours as needed. For pain   Past Week at Unknown time  . Calcium  Carbonate-Vitamin D (CALCIUM 600/VITAMIN D) 600-400 MG-UNIT per tablet Take 1 tablet by mouth 2 (two) times daily.   09/11/2014 at Unknown time  . celecoxib (CELEBREX) 200 MG capsule Take 200 mg by mouth daily.   09/11/2014  . cetirizine (ZYRTEC) 10 MG tablet Take 10 mg by mouth at bedtime.   09/11/2014  . clobetasol cream (TEMOVATE) 0.05 % Apply 1 application topically 2 (two) times daily as needed (skin outbreak).    09/12/2014 at Unknown time  . econazole nitrate 1 % cream Apply topically daily. Apply to affected area twice daily. (Patient taking differently: Apply 1 application topically daily as needed (skin outbreak). Apply to affected area twice daily.) 60 g 5 09/11/2014  . ezetimibe-simvastatin (VYTORIN) 10-40 MG per tablet Take 1 tablet by mouth daily.   09/11/2014  . FLUoxetine (PROZAC) 20 MG capsule Take 40 mg by mouth daily.   09/11/2014  . furosemide (LASIX) 40 MG tablet Take 40 mg by mouth daily.   09/11/2014  . gabapentin (NEURONTIN) 400 MG capsule Take 400 mg by mouth 2 (two) times daily.   09/11/2014  . hydrOXYzine (ATARAX/VISTARIL) 25 MG tablet Take 25 mg by mouth 2 (two) times daily as needed for itching (itching).    09/11/2014  . insulin glargine (LANTUS) 100 UNIT/ML injection Inject 30 Units into the skin at bedtime.   09/11/2014  . isosorbide mononitrate (IMDUR) 60 MG 24 hr tablet Take 60 mg by mouth daily.  09/11/2014  . losartan (COZAAR) 50 MG tablet Take 50 mg by mouth daily.   09/11/2014  . magnesium oxide (MAG-OX) 400 MG tablet Take 400 mg by mouth daily.   09/11/2014  . memantine (NAMENDA) 10 MG tablet Take 10 mg by mouth 2 (two) times daily.   09/11/2014  . metFORMIN (GLUCOPHAGE) 500 MG tablet Take 500 mg by mouth 2 (two) times daily with a meal.   09/11/2014  . metoprolol succinate (TOPROL-XL) 25 MG 24 hr tablet Take 25 mg by mouth 2 (two) times daily.   09/11/2014  . nitroGLYCERIN (NITROLINGUAL) 0.4 MG/SPRAY spray Place 1 spray under the tongue every 5 (five) minutes  as needed. For chest pain   unknown  . omeprazole (PRILOSEC) 20 MG capsule Take 20 mg by mouth daily.   09/11/2014  . oxyCODONE-acetaminophen (PERCOCET/ROXICET) 5-325 MG per tablet Take 1 tablet by mouth every 4 (four) hours as needed for severe pain. 15 tablet 0 09/11/2014  . potassium chloride SA (K-DUR,KLOR-CON) 20 MEQ tablet Take 20 mEq by mouth 2 (two) times daily.   09/11/2014  . predniSONE (DELTASONE) 10 MG tablet Take 10 mg by mouth daily with breakfast.    09/11/2014  . promethazine (PHENERGAN) 25 MG tablet Take 25 mg by mouth every 6 (six) hours as needed for nausea.    09/11/2014  . SPIRIVA HANDIHALER 18 MCG inhalation capsule Place 18 mcg into inhaler and inhale daily as needed (copd).    09/11/2014  . Tamsulosin HCl (FLOMAX) 0.4 MG CAPS Take 0.4 mg by mouth every evening.   09/11/2014  . traMADol (ULTRAM) 50 MG tablet Take 1 tablet (50 mg total) by mouth every 6 (six) hours as needed. 20 tablet 0 09/11/2014  . triamcinolone cream (KENALOG) 0.1 % Apply 1 application topically 2 (two) times daily as needed (skin outbreak).    09/11/2014  . ZONALON 5 % cream Apply 1 application topically daily as needed for itching (back).    09/11/2014     Social History: History   Social History  . Marital Status: Married    Spouse Name: N/A    Number of Children: N/A  . Years of Education: N/A   Occupational History  . Not on file.   Social History Main Topics  . Smoking status: Former Games developermoker  . Smokeless tobacco: Not on file  . Alcohol Use: No  . Drug Use: No  . Sexual Activity: Not on file   Other Topics Concern  . Not on file   Social History Narrative    Family History: Family History  Problem Relation Age of Onset  . Dementia Mother   . Congestive Heart Failure Father     Review of Systems: Positive: Altered mental status, recent gross hematuria, dementia Negative:  A further 10 point review of systems was negative except what is listed in the HPI.  Physical  Exam: @VITALS2 @ General: No acute distress.  Awake. He does answer to his name and seems appropriate Head:  Normocephalic.  Atraumatic. ENT:  EOMI.  Mucous membranes moist Neck:  Supple.  No lymphadenopathy. CV:   Regular rate. Abdomen: Soft.  Non- tender to palpation. Skin:  Normal turgor.  No visible rash. Extremity: No gross deformity of bilateral upper extremities.  No gross deformity of    bilateral lower extremities. Neurologic: Alert. Appropriate mood. Rectal:  Normal anal sphincter tone. Prostate is small, symmetrical, nonnodular, nontender Penis:              Uncircumcised.  No  lesions. There is mild penile edema Urethra: Foley catheter in place.  Orthotopic meatus. Scrotum: No lesions.  No ecchymosis.  No erythema. Testes: Atrophic, no tenderness  Studies:  Recent Labs     09/15/14  0340  09/15/14  1432  HGB  8.9*  9.9*  WBC  8.8  6.8  PLT  152  171    Recent Labs     09/14/14  0348  09/15/14  0340  NA  145  144  K  3.2*  3.5*  CL  105  105  CO2  26  28  BUN  20  16  CREATININE  1.13  0.98  CALCIUM  6.6*  7.1*  GFRNONAA  58*  73*  GFRAA  67*  85*     No results for input(s): INR, APTT in the last 72 hours.  Invalid input(s): PT   Invalid input(s): ABG  I reviewed the patient's CT images. There is perinephric inflammation which seems chronic. There is renal atrophy bilaterally. No renal calculi are seen. Catheter is present within the bladder. Calcifications are present within the bladder-these seemed to be circumferential around the catheter.  Assessment:  Gross hematuria. Most likely secondary to bladder irritation after catheter placement  Plan: I reassured the patient and his daughters, who attended the visit today  I don't think any further evaluation or treatment is warranted at this point in this 80 year old demented man  I would suggest catheter irrigation if there are worse clots. These seem to be clearing now, however  Removed catheter  Richard leisure, when the patient is more alert and ambulatory    Pager:[phone]

## 2014-09-16 ENCOUNTER — Encounter (HOSPITAL_COMMUNITY): Payer: Self-pay | Admitting: Internal Medicine

## 2014-09-16 DIAGNOSIS — I714 Abdominal aortic aneurysm, without rupture, unspecified: Secondary | ICD-10-CM | POA: Diagnosis present

## 2014-09-16 HISTORY — DX: Abdominal aortic aneurysm, without rupture: I71.4

## 2014-09-16 HISTORY — DX: Abdominal aortic aneurysm, without rupture, unspecified: I71.40

## 2014-09-16 LAB — BASIC METABOLIC PANEL
Anion gap: 8 (ref 5–15)
BUN: 12 mg/dL (ref 6–23)
CALCIUM: 7.4 mg/dL — AB (ref 8.4–10.5)
CO2: 30 meq/L (ref 19–32)
CREATININE: 0.86 mg/dL (ref 0.50–1.35)
Chloride: 105 mEq/L (ref 96–112)
GFR calc non Af Amer: 77 mL/min — ABNORMAL LOW (ref >90)
GFR, EST AFRICAN AMERICAN: 90 mL/min — AB (ref >90)
Glucose, Bld: 117 mg/dL — ABNORMAL HIGH (ref 70–99)
Potassium: 3.2 mEq/L — ABNORMAL LOW (ref 3.7–5.3)
Sodium: 143 mEq/L (ref 137–147)

## 2014-09-16 LAB — URINE CULTURE
Colony Count: NO GROWTH
Culture: NO GROWTH

## 2014-09-16 LAB — CBC
HEMATOCRIT: 30.3 % — AB (ref 39.0–52.0)
Hemoglobin: 8.9 g/dL — ABNORMAL LOW (ref 13.0–17.0)
MCH: 25.7 pg — AB (ref 26.0–34.0)
MCHC: 29.4 g/dL — AB (ref 30.0–36.0)
MCV: 87.6 fL (ref 78.0–100.0)
Platelets: 182 10*3/uL (ref 150–400)
RBC: 3.46 MIL/uL — ABNORMAL LOW (ref 4.22–5.81)
RDW: 17.6 % — ABNORMAL HIGH (ref 11.5–15.5)
WBC: 7 10*3/uL (ref 4.0–10.5)

## 2014-09-16 LAB — GLUCOSE, CAPILLARY
GLUCOSE-CAPILLARY: 154 mg/dL — AB (ref 70–99)
Glucose-Capillary: 207 mg/dL — ABNORMAL HIGH (ref 70–99)
Glucose-Capillary: 170 mg/dL — ABNORMAL HIGH (ref 70–99)
Glucose-Capillary: 201 mg/dL — ABNORMAL HIGH (ref 70–99)

## 2014-09-16 LAB — MAGNESIUM: Magnesium: 2.1 mg/dL (ref 1.5–2.5)

## 2014-09-16 MED ORDER — AMOXICILLIN-POT CLAVULANATE 875-125 MG PO TABS
1.0000 | ORAL_TABLET | Freq: Two times a day (BID) | ORAL | Status: AC
  Administered 2014-09-16 – 2014-09-19 (×8): 1 via ORAL
  Filled 2014-09-16 (×9): qty 1

## 2014-09-16 MED ORDER — POTASSIUM CHLORIDE CRYS ER 20 MEQ PO TBCR
40.0000 meq | EXTENDED_RELEASE_TABLET | ORAL | Status: AC
  Administered 2014-09-16 (×2): 40 meq via ORAL
  Filled 2014-09-16 (×2): qty 2

## 2014-09-16 NOTE — Evaluation (Signed)
Occupational Therapy Evaluation Patient Details Name: Festus BarrenSamuel V Faria MRN: [id number] DOB: 1928-05-09 Today's Date: 09/16/2014    History of Present Illness 80 y.o. male, with past medical history of hypertension, diabetes mellitus, COPD, and atrial fibrillation not on anticoagulation, dementia, was brought by his daughters for altered mental status, family report patient has been more confused, lethargic, less responsive over the last 24 hours, which prompted them to bring him to ED,  patient admitted with generalized seizures.  Pt was mostly bedridden and w/c dependent, however daughter states he took a few steps to get to toilet   Clinical Impression   This 80 year old man was admitted for the above.  Daughter assisted extensively with ADLs prior to admission and she also assisted moderately with bed mobility and transfers, and pt was able to take a few steps to commode, holding onto counter, grab bars.  Pt now needs max A x 2 for rolling in bed and he needed total A x 2 to sit on EOB earlier with PT.  Pt will benefit from skilled OT to work on mobility related to ADLs to decrease burden of care on family.    Follow Up Recommendations  Home health OT with 24/7--may need A x 2.  Pt would benefit from SNF, but daughter does not want to consider this option    Equipment Recommendations  None recommended by OT    Recommendations for Other Services       Precautions / Restrictions Precautions Precautions: Fall Precaution Comments: monitor HR Restrictions Weight Bearing Restrictions: No      Mobility Bed Mobility     Rolling: +2 for physical assistance;Total assist (to bil sides--pt initiated/assisted about 20%)         General bed mobility comments: assisted pt with flexing leg and reaching across bed.  Pt weak but initiated movement  Transfers                 General transfer comment: not attempted    Balance                                         ADL Overall ADL's : Needs assistance/impaired     Grooming: Minimal assistance;Wash/dry hands                                 General ADL Comments: pt followed most one step commands with multimodal cues.  pt needs overall mod A and multimodal cues for UB adls and total A x 2 for LB ADLs.  Did not sit EOB today as pt needed total A x 2 earlier.  Rolled for ADLs--pt does participate but he will need total A x 2 to get completely on side   Prior to admission, pt got up with a little assistance and took steps in bathroom to toilet, holding onto counter, etc.  Daughter is committed to taking pt home:  explained that she may need a second person to assist, and that she could have him roll for LB ADLs and using bedpan, if needed.  Pt has a bed in which head and knees rise.  Also discussed positioning for pressure relief.  PT had discussed lift for OOB.     Vision  Perception     Praxis      Pertinent Vitals/Pain Pain Assessment: Faces Pain Score: 4  Faces Pain Scale: Hurts little more Pain Location: LLE when flexed up Pain Intervention(s): Limited activity within patient's tolerance;Monitored during session     Hand Dominance     Extremity/Trunk Assessment Upper Extremity Assessment Upper Extremity Assessment: Generalized weakness           Communication Communication Communication: No difficulties   Cognition Arousal/Alertness: Awake/alert Behavior During Therapy: WFL for tasks assessed/performed Overall Cognitive Status: History of cognitive impairments - at baseline                     General Comments       Exercises       Shoulder Instructions      Home Living Family/patient expects to be discharged to:: Private residence Living Arrangements: Spouse/significant other;Children Available Help at Discharge: Family Type of Home: House                 Bath[facility] Toilet[facility]     Home  Equipment: Wheelchair - manual   Additional Comments: several grab bars in bathroom but inconveniently placed per daughter.  Pt would furniture walk to toilet once w/c placed at bathroom door      Prior Functioning/Environment Level of Independence: Needs assistance  Gait / Transfers Assistance Needed: daughter reports pt requires some assist for bed mobility and transfer to w/c and ADLs          OT Diagnosis: Generalized weakness   OT Problem List: Decreased strength;Decreased activity tolerance;Impaired balance (sitting and/or standing);Decreased cognition;Pain   OT Treatment/Interventions: Self-care/ADL training;DME and/or AE instruction;Therapeutic activities;Patient/family education;Balance training    OT Goals(Current goals can be found in the care plan section) Acute Rehab OT Goals Patient Stated Goal: daughter wishes to improve/maintain pt strength in order to return pt home with decreased burden of care OT Goal Formulation: With family Time For Goal Achievement: 09/30/14 Potential to Achieve Goals: Fair ADL Goals Additional ADL Goal #1: Pt will roll completely to bil sides with max A of one for adls/pre-transfer Additional ADL Goal #2: pt will sit unsupported EOB x 2 minutes with min guard and mod cues in preparation for transfers/ADLs Additional ADL Goal #3: Pt will stand with max A x 2 for 2 minutes for ADLs Additional ADL Goal #4: Daughter will be able to direct another person to help position pt for pressure relief  OT Frequency: Min 2X/week   Barriers to D/C:            Co-evaluation              End of Session    Activity Tolerance: Patient limited by fatigue Patient left: in bed;with call bell/phone within reach;with family/visitor present   Time: [id number] OT Time Calculation (min): 16 min Charges:  OT General Charges $OT Visit: 1 Procedure OT Evaluation $Initial OT Evaluation Tier I: 1 Procedure OT Treatments $Therapeutic Activity: 8-22  mins G-Codes:    Halvor Behrend 09/16/2014, 3:50 PM Marica OtterMaryellen Ailynn Gow, OTR/L [phone] 09/16/2014

## 2014-09-16 NOTE — Progress Notes (Signed)
[facility] PROGRESS NOTE  Glen BarrenSamuel V Shatto ZOX:[id number] DOB: [birth year] DOA: 09/12/2014 PCP: Georgann HousekeeperHUSAIN,KARRAR, MD  Admission history of present illness/brief narrative:  Glen BoSamuel Miller is a 80 y.o. male, with past medical history of hypertension, diabetes mellitus, COPD, and atrial fibrillation not on anticoagulation, dementia, baseline bedridden/wheelchair dependent, verbal, brought by his daughters for altered mental status, family report patient has been more confused, lethargic, less responsive over the last 24 hours, which prompted them to bring him to ED, patient was noticed to have seizures, received loading dose of IV Keppra 1000 mg, and started on 500 mg IV twice a day as per neurology recommendation, seen by neurology service, with recommendation of MRI brain, continue , EEG. No further seizures since admission. workup was significant for pneumonia, most likely aspiration as family report coughing during eating, chest x-ray showing evidence of infiltrate/opacity, patient was started on IV vancomycin twice and and Zosyn . as well patient was noticed to have hypernatremia, he has known history of A. fib, was in A. fib with RVR requiring Cardizem drip initially. Patient is being transitioned to oral Cardizem, with the Toprol Cardizem drip and transferred to telemetry floor. Agent does have multiple electrolyte abnormalities, which does require aggressive replacement, loading hypokalemia, hypomagnesemia, hypocalcemia, patient had to runs of 2 beats of nonsustained V. Tach, this is most likely related to his tract abnormality, continue to monitor on telemetry. Patient had urinary retention on first night, where he required Foley catheter.     Assessment/Plan: #1 generalized seizures Unknown etiology. Abnormal electrolytes may have played a role. CT head negative. MRI head negative. EEG negative. Patient with no further seizures. Currently on oral Keppra. Continue seizure precautions.  Continue to replete electrolytes. Continue antibiotics. Will need outpatient follow-up with neurology.  #2 acute encephalopathy Likely multifactorial in nature secondary to generalized seizures, hypernatremia, hypomagnesium, hypokalemia, infectious etiology of probable aspiration pneumonia in the setting of dementia. Clinical improvement. Continue to replete electrolytes. Continue Keppra. Continue empiric antibiotics. Follow.  #3 A. Fib with RVR Patient is no longer on the Cardizem drip. Patient's heart rate has remained stable. Continue Cardizem 360 mg daily. Continue metoprolol. Patient with hematuria and also a history of dementia likely a poor candidate for anticoagulation. Follow.  #4 pneumonia/probable aspiration pneumonia Blood cultures with no growth to date. Afebrile.clinical improvement. Change IV Zosyn to oral Augmentin. Follow.  #5 hypernatremia Secondary to volume depletion and dehydration. Improvement with D5 half-normal saline. Follow.  #6 hypernatremia/hypokalemia/hypomagnesemia/hypocalcemia Improving. Hypernatremia has improved.hypokalemia with some improvement however we'll continue to replete. Keep magnesium greater than 2. Corrected calcium is 8.9. Follow.  #7 hematuria Likely secondary to bladder irritation/trauma post catheter placement. CT of the abdomen and pelvis with some chronic inflammation, renal atrophic bilaterally, nonobstructing calculi. Hematuria improving. Patient has been seen by urology at no further workup is needed at this time. Urology following and appreciate input and recommendations.  #8 anemia/acute blood loss anemia Likely secondary to hematuria. Anemia panel pending. Hemoglobin currently at 8.9 and stable. Hematuria improving.  from 9.3. CT of the abdomen and pelvis with descending thoracic aortic aneurysm of 6.0 x 5.8 cm, no definite evidence of intramural hematoma or perianeurysmal hemorrhage, diverticulosis without diverticulitis, nonobstructing  calculi and chronic infiltration of the peripelvic and perinephric fat planes small bladder calculus. Follow H&H. Transfuse for hemoglobin less than 8. Follow.  #9 nonsustained V. Tach Likely secondary to electrolyte abnormalities of hypokalemia and hypomagnesemia. No further events overnight. Continue to replete electrolytes. Continue low-dose beta blocker.  #10 AAA Per  daughter this is chronic in nature and PCP follows up as outpatient.  #11 prophylaxis SCDs for DVT prophylaxis.    Code Status: DO NOT RESUSCITATE Family Communication: updated patient and daughter at bedside. Disposition Plan: Transfer to telemetry.   Consultants:  Neurology: Dr.Dr. Roseanne RenoStewart 09/12/2014  Urology: Dr. Retta Dionesahlstedt 09/15/14  Procedures:  EEG 09/13/2014  Chest x-ray 09/12/2014  CT head 09/12/2014  MRI head 09/13/2014  CT abd/pelvis 09/15/14  Antibiotics:  IV Zosyn 09/12/2014  IV vancomycin 09/12/2014  IV azithromycin 09/12/2014>>>09/13/2014  IV Rocephin 09/12/2014>>> 09/13/2014  HPI/Subjective: Patient more alert. Patient following commands. Patient's mental status is significantly improved. Patient with improved hematuria.   Objective: Filed Vitals:   09/16/14 0800  BP: 147/61  Pulse: 85  Temp: 97.6 F (36.4 C)  Resp: 19    Intake/Output Summary (Last 24 hours) at 09/16/14 1051 Last data filed at 09/16/14 0400  Gross per 24 hour  Intake    570 ml  Output    550 ml  Net     20 ml   Filed Weights   09/12/14 1453 09/14/14 0400 09/15/14 0400  Weight: 92.6 kg (204 lb 2.3 oz) 96.4 kg (212 lb 8.4 oz) 97.8 kg (215 lb 9.8 oz)    Exam:   General:  NAD  Cardiovascular: RRR  Respiratory: CTAB anterior lung fields.  Abdomen: soft, nontender, nondistended, positive bowel sounds.  Musculoskeletal: no clubbing cyanosis or edema.  Data Reviewed: Basic Metabolic Panel:  Recent Labs Lab 09/12/14 0800 09/12/14 0822 09/13/14 0334 09/14/14 0345 09/14/14 0348  09/15/14 0340 09/16/14 0443  NA 150* 147 149*  --  145 144 143  K 3.8 3.6* 2.8*  --  3.2* 3.5* 3.2*  CL 95* 98 107  --  105 105 105  CO2 24  --  28  --  26 28 30   GLUCOSE 214* 210* 142*  --  164* 178* 117*  BUN 19 21 20   --  20 16 12   CREATININE 1.26 1.20 1.31  --  1.13 0.98 0.86  CALCIUM 8.1*  --  6.2*  --  6.6* 7.1* 7.4*  MG  --   --  0.4*  --  1.3* 1.7 2.1  PHOS  --   --   --  3.1  --   --   --    Liver Function Tests:  Recent Labs Lab 09/12/14 0800 09/15/14 0340  AST 27 15  ALT 9 8  ALKPHOS 60 42  BILITOT 0.7 0.6  PROT 6.8 5.1*  ALBUMIN 3.0* 2.1*   No results for input(s): LIPASE, AMYLASE in the last 168 hours. No results for input(s): AMMONIA in the last 168 hours. CBC:  Recent Labs Lab 09/12/14 0800  09/13/14 0334 09/14/14 0348 09/15/14 0340 09/15/14 1432 09/16/14 0443  WBC 16.5*  --  9.2 9.8 8.8 6.8 7.0  NEUTROABS 14.6*  --   --   --   --   --   --   HGB 11.4*  < > 9.2* 9.3* 8.9* 9.9* 8.9*  HCT 39.0  < > 30.5* 31.3* 30.0* 33.6* 30.3*  MCV 89.7  --  86.2 86.7 87.0 87.7 87.6  PLT 171  --  141* 140* 152 171 182  < > = values in this interval not displayed. Cardiac Enzymes:  Recent Labs Lab 09/12/14 0800  TROPONINI 0.33*   BNP (last 3 results) No results for input(s): PROBNP in the last 8760 hours. CBG:  Recent Labs Lab 09/15/14 0352 09/15/14 0827 09/15/14 1158 09/15/14  1716 09/15/14 2127  GLUCAP 153* 150* 225* 362* 196*    Recent Results (from the past 240 hour(s))  Blood culture (routine x 2)     Status: None (Preliminary result)   Collection Time: 09/12/14  9:23 AM  Result Value Ref Range Status   Specimen Description BLOOD RIGHT ANTECUBITAL  Final   Special Requests BOTTLES DRAWN AEROBIC AND ANAEROBIC 5 CC EACH  Final   Culture  Setup Time   Final    09/12/2014 19:25 Performed at [facility]    Culture   Final           BLOOD CULTURE RECEIVED NO GROWTH TO DATE CULTURE WILL BE HELD FOR 5 DAYS BEFORE ISSUING A FINAL NEGATIVE  REPORT Performed at [facility]    Report Status PENDING  Incomplete  Blood culture (routine x 2)     Status: None (Preliminary result)   Collection Time: 09/12/14  9:23 AM  Result Value Ref Range Status   Specimen Description BLOOD LEFT ANTECUBITAL  Final   Special Requests BOTTLES DRAWN AEROBIC AND ANAEROBIC 5 CC EACH  Final   Culture  Setup Time   Final    09/12/2014 19:25 Performed at [facility]    Culture   Final           BLOOD CULTURE RECEIVED NO GROWTH TO DATE CULTURE WILL BE HELD FOR 5 DAYS BEFORE ISSUING A FINAL NEGATIVE REPORT Performed at [facility]    Report Status PENDING  Incomplete  Urine culture     Status: None   Collection Time: 09/12/14 10:10 AM  Result Value Ref Range Status   Specimen Description URINE, CATHETERIZED  Final   Special Requests NONE  Final   Culture  Setup Time   Final    09/12/2014 15:54 Performed at [facility]    Colony Count NO GROWTH Performed at [facility]   Final   Culture NO GROWTH Performed at [facility]   Final   Report Status 09/13/2014 FINAL  Final  MRSA PCR Screening     Status: None   Collection Time: 09/12/14  3:47 PM  Result Value Ref Range Status   MRSA by PCR NEGATIVE NEGATIVE Final    Comment:        The GeneXpert MRSA Assay (FDA approved for NASAL specimens only), is one component of a comprehensive MRSA colonization surveillance program. It is not intended to diagnose MRSA infection nor to guide or monitor treatment for MRSA infections.      Studies: Ct Abdomen Pelvis Wo Contrast  09/15/2014   CLINICAL DATA:  Gross hematuria, anemia, history kidney stones, hypertension, diabetes, atrial fibrillation, COPD  EXAM: CT ABDOMEN AND PELVIS WITHOUT CONTRAST  TECHNIQUE: Multidetector CT imaging of the abdomen and pelvis was performed following the standard protocol without IV contrast.  COMPARISON:  09/25/2005  FINDINGS: Bibasilar atelectasis and small LEFT  pleural effusion.  Aneurysmal dilatation of the descending thoracic aorta, 5.7 cm transverse at the proximal descending aorta, 6.0 x 5.8 cm diameter at mid descending aorta, and 5.0 cm at distal ascending aorta.  Scattered atherosclerotic changes throughout abdominal aorta, iliac and coronary arteries.  No evidence of intramural hematoma or perianeurysmal hemorrhage.  BILATERAL minimal collecting system dilatation with significant chronic perinephric and peripelvic edema at both kidneys.  Tiny nonobstructing BILATERAL renal calculi.  No definite ureteral calcification or dilatation.  Gallbladder surgically absent.  Liver, spleen, pancreas, and adrenal glands unremarkable for technique.  Normal  appendix.  Foley catheter decompresses urinary bladder with note of a tiny amount of high attenuation material adjacent to the Foley catheter balloon, question tiny bladder calculus.  Distal colonic diverticulosis without evidence of diverticulitis.  Stomach and bowel loops otherwise grossly unremarkable for technique.  Low-attenuation circulating blood question anemia.  Small supraumbilical hernia containing fat.  Tiny umbilical and RIGHT inguinal hernia containing fat.  Bones demineralized with multilevel degenerative disc disease changes of the thoracolumbar spine.  IMPRESSION: Significant atherosclerotic changes with aneurysmal dilatation of the descending thoracic aorta achieving greatest size of 6.0 x 5.8 cm at the mid descending aorta.  No definite evidence of intramural hematoma or perianeurysmal hemorrhage.  Minimal chronic collecting system dilatation in both kidneys with tiny nonobstructing calculi and chronic infiltration of peripelvic and perinephric fat planes, stable since 2006, question sequela of previous inflammation or hemorrhage.  Questionable small bladder calculus.  Supraumbilical, umbilical and RIGHT inguinal hernias containing fat.  Sigmoid diverticulosis without diverticulitis.   Electronically Signed    By: Ulyses Southward M.D.   On: 09/15/2014 16:06    Scheduled Meds: . ezetimibe  10 mg Oral q1800   And  . atorvastatin  20 mg Oral q1800  . diltiazem  360 mg Oral Daily  . feeding supplement (GLUCERNA SHAKE)  237 mL Oral BID BM  . insulin aspart  0-9 Units Subcutaneous TID WC  . insulin glargine  20 Units Subcutaneous QHS  . levETIRAcetam  500 mg Oral BID  . metoprolol tartrate  12.5 mg Oral BID  . piperacillin-tazobactam (ZOSYN)  IV  3.375 g Intravenous Q8H  . potassium chloride  40 mEq Oral Q4H  . sodium chloride  3 mL Intravenous Q12H   Continuous Infusions: . sodium chloride 10 mL/hr at 09/15/14 0101    Principal Problem:   Generalized seizures Active Problems:   Dementia   Chronic atrial fibrillation   Acute encephalopathy   PNA (pneumonia)   Hypernatremia   Hematuria, gross   Hypokalemia   Hypomagnesemia   Anemia   Acute blood loss anemia   CAP (community acquired pneumonia)   AAA (abdominal aortic aneurysm) without rupture    Time spent: 40 minutes    Marisella Puccio M.D. [facility] Pager [phone]. If 7PM-7AM, please contact night-coverage at [email or web address], password [facility] 09/16/2014, 10:51 AM  LOS: 4 days

## 2014-09-16 NOTE — Evaluation (Addendum)
Physical Therapy Evaluation Patient Details Name: Glen BarrenSamuel V Wardrop MRN: [id number] DOB: Mar 02, 1928 Today's Date: 09/16/2014   History of Present Illness  80 y.o. male, with past medical history of hypertension, diabetes mellitus, COPD, and atrial fibrillation not on anticoagulation, dementia, baseline bedridden/wheelchair dependent, verbal, brought by his daughters for altered mental status, family report patient has been more confused, lethargic, less responsive over the last 24 hours, which prompted them to bring him to ED,  patient admitted with generalized seizures    Clinical Impression  Pt admitted with above diagnosis. Pt currently with functional limitations due to the deficits listed below (see PT Problem List). Pt assisted to EOB with total assist +2 however pt able to sit EOB min/guard.  Daughter present and reports pt usually assists with stand pivot to w/c and worries pt may become weaker by laying in bed while in hospital so very agreeable for PT to assist with mobility.  Pt will benefit from skilled PT to increase their independence and safety with mobility to allow discharge to the venue listed below.  Daughter reports she will take pt home, does not want pt to go to SNF.  She assists pt with transfers at home however also worried about possibly hurting herself while trying to assist him.  Discussed recommendation for hoyer lift at home to assist with transfers and maintain pt and caregiver safety.     Follow Up Recommendations Home health PT;Supervision/Assistance - 24 hour (daughter declines pt to SNF, wishes to take pt home)    Equipment Recommendations  Other (comment) (hoyer lift)    Recommendations for Other Services       Precautions / Restrictions Precautions Precautions: Fall Precaution Comments: monitor HR      Mobility  Bed Mobility Overal bed mobility: Needs Assistance;+2 for physical assistance Bed Mobility: Supine to Sit;Sit to Supine     Supine to sit:  Total assist;+2 for physical assistance Sit to supine: Total assist;+2 for physical assistance   General bed mobility comments: mulitmodal cues for pt to assist however pt unable  Transfers Overall transfer level:  (deferred today due to weakness )                  Ambulation/Gait                Stairs            Wheelchair Mobility    Modified Rankin (Stroke Patients Only)       Balance Overall balance assessment: Needs assistance Sitting-balance support: Bilateral upper extremity supported;Feet supported Sitting balance-Leahy Scale: Fair Sitting balance - Comments: once pt assisted to upright position EOB , able to hold sitting EOB for 4 minutes and then reported fatigue and request for return to supine                                     Pertinent Vitals/Pain Pain Assessment: No/denies pain  Vitals WNL per monitor during session.  RN reports HR elevated earlier this morning however remained 100 or below during session.    Home Living Family/patient expects to be discharged to:: Private residence Living Arrangements: Spouse/significant other Available Help at Discharge: Family Type of Home: House       Home Layout: One level Home Equipment: Wheelchair - manual      Prior Function Level of Independence: Needs assistance   Gait / Transfers Assistance Needed: daughter reports pt requires  some assist for bed mobility and transfer to w/c            Hand Dominance        Extremity/Trunk Assessment               Lower Extremity Assessment: Generalized weakness         Communication   Communication: No difficulties  Cognition Arousal/Alertness: Awake/alert Behavior During Therapy: WFL for tasks assessed/performed Overall Cognitive Status: History of cognitive impairments - at baseline                      General Comments      Exercises        Assessment/Plan    PT Assessment Patient needs  continued PT services  PT Diagnosis Generalized weakness   PT Problem List Decreased strength;Decreased activity tolerance;Decreased mobility  PT Treatment Interventions DME instruction;Functional mobility training;Patient/family education;Wheelchair mobility training;Therapeutic activities;Therapeutic exercise;Balance training   PT Goals (Current goals can be found in the Care Plan section) Acute Rehab PT Goals Patient Stated Goal: daughter wishes to improve/maintain pt strength in order to return pt home with decreased burden of care PT Goal Formulation: With patient/family Time For Goal Achievement: 09/30/14 Potential to Achieve Goals: Fair    Frequency Min 3X/week   Barriers to discharge        Co-evaluation               End of Session   Activity Tolerance: Patient limited by fatigue Patient left: in bed;with call bell/phone within reach;with family/visitor present           Time: 0900-0918 PT Time Calculation (min) (ACUTE ONLY): 18 min   Charges:   PT Evaluation $Initial PT Evaluation Tier I: 1 Procedure PT Treatments $Therapeutic Activity: 8-22 mins   PT G Codes:          Kvion Shapley,KATHrine E 09/16/2014, 12:41 PM Zenovia JarredKati Faruq Rosenberger, PT, DPT 09/16/2014 Pager: [phone]

## 2014-09-17 LAB — BASIC METABOLIC PANEL
ANION GAP: 6 (ref 5–15)
BUN: 12 mg/dL (ref 6–23)
CALCIUM: 8.3 mg/dL — AB (ref 8.4–10.5)
CO2: 30 meq/L (ref 19–32)
CREATININE: 0.88 mg/dL (ref 0.50–1.35)
Chloride: 106 mEq/L (ref 96–112)
GFR calc Af Amer: 89 mL/min — ABNORMAL LOW (ref >90)
GFR, EST NON AFRICAN AMERICAN: 77 mL/min — AB (ref >90)
Glucose, Bld: 165 mg/dL — ABNORMAL HIGH (ref 70–99)
Potassium: 4.2 mEq/L (ref 3.7–5.3)
Sodium: 142 mEq/L (ref 137–147)

## 2014-09-17 LAB — GLUCOSE, CAPILLARY
GLUCOSE-CAPILLARY: 144 mg/dL — AB (ref 70–99)
GLUCOSE-CAPILLARY: 213 mg/dL — AB (ref 70–99)
GLUCOSE-CAPILLARY: 185 mg/dL — AB (ref 70–99)
GLUCOSE-CAPILLARY: 179 mg/dL — AB (ref 70–99)
Glucose-Capillary: 167 mg/dL — ABNORMAL HIGH (ref 70–99)
Glucose-Capillary: 161 mg/dL — ABNORMAL HIGH (ref 70–99)

## 2014-09-17 LAB — CBC
HCT: 32.3 % — ABNORMAL LOW (ref 39.0–52.0)
Hemoglobin: 9.4 g/dL — ABNORMAL LOW (ref 13.0–17.0)
MCH: 25.5 pg — AB (ref 26.0–34.0)
MCHC: 29.1 g/dL — AB (ref 30.0–36.0)
MCV: 87.8 fL (ref 78.0–100.0)
PLATELETS: 186 10*3/uL (ref 150–400)
RBC: 3.68 MIL/uL — AB (ref 4.22–5.81)
RDW: 17.4 % — ABNORMAL HIGH (ref 11.5–15.5)
WBC: 7.5 10*3/uL (ref 4.0–10.5)

## 2014-09-17 NOTE — Progress Notes (Signed)
Clinical Social Work Department CLINICAL SOCIAL WORK PLACEMENT NOTE 09/17/2014  Patient:  Festus BarrenBLAKE,Dareon V  Account Number:  [id number] Admit date:  09/12/2014  Clinical Social Worker:  Orpah GreekKELLY FOLEY, LCSWA  Date/time:  09/17/2014 03:10 PM  Clinical Social Work is seeking post-discharge placement for this patient at the following level of care:   SKILLED NURSING   (*CSW will update this form in Epic as items are completed)   09/17/2014  Patient/family provided with [facility] Department of Clinical Social Work's list of facilities offering this level of care within the geographic area requested by the patient (or if unable, by the patient's family).  09/17/2014  Patient/family informed of their freedom to choose among providers that offer the needed level of care, that participate in Medicare, Medicaid or managed care program needed by the patient, have an available bed and are willing to accept the patient.  09/17/2014  Patient/family informed of [facility]' ownership interest in [facility], as well as of the fact that they are under no obligation to receive care at this facility.  PASARR submitted to EDS on 09/17/2014 PASARR number received on 09/17/2014  FL2 transmitted to all facilities in geographic area requested by pt/family on  09/17/2014 FL2 transmitted to all facilities within larger geographic area on   Patient informed that his/her managed care company has contracts with or will negotiate with  certain facilities, including the following:     Patient/family informed of bed offers received:  09/17/2014 [facility] Physician recommends and patient chooses bed at    [facility] on   Patient to be transferred to facility by  Patient and family notified of transfer on  Name of family member notified:    The following physician request were entered in  Epic:   Additional Comments:   Lincoln MaxinKelly Loucile Posner, LCSW [facility] Clinical Social Worker cell #: [phone]

## 2014-09-17 NOTE — Progress Notes (Signed)
Clinical Social Work Department BRIEF PSYCHOSOCIAL ASSESSMENT 09/17/2014  Patient:  Glen Miller,Glen Miller     Account Number:  [id number]     Admit date:  09/12/2014  Clinical Social Worker:  Orpah GreekFOLEY,Lenice Koper, LCSWA  Date/Time:  09/17/2014 03:07 PM  Referred by:  Physician  Date Referred:  09/17/2014 Referred for  SNF Placement   Other Referral:   Interview type:  Family Other interview type:   patient's daughter, Glen Miller & wife, Glen Miller at bedside    PSYCHOSOCIAL DATA Living Status:  WIFE Admitted from facility:   Level of care:   Primary support name:  Glen Miller (wife) ph#: [phone] Primary support relationship to patient:  SPOUSE Degree of support available:   good    CURRENT CONCERNS Current Concerns  Post-Acute Placement   Other Concerns:    SOCIAL WORK ASSESSMENT / PLAN CSW received consult for SNF placement.   Assessment/plan status:  [facility] Other assessment/ plan:   Information/referral to community resources:   CSW completed FL2 and [facility].    PATIENT'S/FAMILY'S RESPONSE TO PLAN OF CARE: Patient's daughter informed CSW that she lives with her parents and help them out but if patient is unable to get out of the bed without 2 people assisting, that he would have to go to a facility. CSW provided family with bed offers, wife & daughter expressed interest in [facility] as [facility] is [facility] to their house. CSW confirmed with Toniann [facility]Wendy [facility] that they would have a private room available tomorrow, wife & daughter to tour this afternoon.         Lincoln MaxinKelly Quierra Silverio, LCSW [facility] Clinical Social Worker cell #: [phone]

## 2014-09-17 NOTE — Progress Notes (Signed)
Pt resides at home with daughter. Physical Therapy not seeing Pt today and daughter requesting Pt to be up in the chair. Pt assisted to a sitting position on side of bed but Pt unable to hold self up without assist or able to push up to a standing position. Placed back to bed. Pt's Daughter at bedside and states that unless Pt can stand and pivot it would be difficult to care for Pt at home. Discussed situation with MD via phone and Social Worker consult ordered. Family updated regarding discussion with MD and that a Child psychotherapistocial Worker will visit to discuss options

## 2014-09-17 NOTE — Progress Notes (Signed)
[facility] PROGRESS NOTE  Glen Miller WJX:[id number] DOB: 05/31/1928 DOA: 09/12/2014 PCP: Georgann Housekeeper, MD  Admission history of present illness/brief narrative:  Glen Miller is a 80 y.o. male, with past medical history of hypertension, diabetes mellitus, COPD, and atrial fibrillation not on anticoagulation, dementia, baseline bedridden/wheelchair dependent, verbal, brought by his daughters for altered mental status, family report patient has been more confused, lethargic, less responsive over the last 24 hours, which prompted them to bring him to ED, patient was noticed to have seizures, received loading dose of IV Keppra 1000 mg, and started on 500 mg IV twice a day as per neurology recommendation, seen by neurology service, with recommendation of MRI brain, continue , EEG. No further seizures since admission. workup was significant for pneumonia, most likely aspiration as family report coughing during eating, chest x-ray showing evidence of infiltrate/opacity, patient was started on IV vancomycin twice and and Zosyn . as well patient was noticed to have hypernatremia, he has known history of A. fib, was in A. fib with RVR requiring Cardizem drip initially. Patient is being transitioned to oral Cardizem, with the Toprol Cardizem drip and transferred to telemetry floor. Agent does have multiple electrolyte abnormalities, which does require aggressive replacement, loading hypokalemia, hypomagnesemia, hypocalcemia, patient had to runs of 2 beats of nonsustained V. Tach, this is most likely related to his tract abnormality, continue to monitor on telemetry. Patient had urinary retention on first night, where he required Foley catheter.     Assessment/Plan: #1 generalized seizures Unknown etiology. Abnormal electrolytes may have played a role. CT head negative. MRI head negative. EEG negative. Patient with no further seizures. Currently on oral Keppra. Continue seizure precautions.  Continue to replete electrolytes. Continue antibiotics. Will need outpatient follow-up with neurology.  #2 acute encephalopathy Likely multifactorial in nature secondary to generalized seizures, hypernatremia, hypomagnesium, hypokalemia, infectious etiology of probable aspiration pneumonia in the setting of dementia. Clinical improvement. Continue to replete electrolytes. Continue Keppra. Continue empiric antibiotics. Follow.  #3 A. Fib with RVR Patient is no longer on the Cardizem drip. Patient's heart rate has remained stable. Continue Cardizem 360 mg daily. Metoprolol d/c'd secondary to bradycardia. Patient with hematuria and also a history of dementia likely a poor candidate for anticoagulation. Follow.  #4 pneumonia/probable aspiration pneumonia Blood cultures with no growth to date. Afebrile.clinical improvement. Continue oral Augmentin. Follow.  #5 hypernatremia Secondary to volume depletion and dehydration. Improvement with D5 half-normal saline. NSL IVF. Follow.  #6 hypernatremia/hypokalemia/hypomagnesemia/hypocalcemia Improving. Hypernatremia has improved.hypokalemia with some improvement however we'll continue to replete. Keep magnesium greater than 2.  Follow.  #7 hematuria Likely secondary to bladder irritation/trauma post catheter placement. CT of the abdomen and pelvis with some chronic inflammation, renal atrophic bilaterally, nonobstructing calculi. Hematuria improving. Patient has been seen by urology at no further workup is needed at this time. Urology following and appreciate input and recommendations.  #8 anemia/acute blood loss anemia Likely secondary to hematuria. Anemia panel pending. Hemoglobin currently at 9.4 and stable. Hematuria improving.  from 9.3. CT of the abdomen and pelvis with descending thoracic aortic aneurysm of 6.0 x 5.8 cm, no definite evidence of intramural hematoma or perianeurysmal hemorrhage, diverticulosis without diverticulitis, nonobstructing  calculi and chronic infiltration of the peripelvic and perinephric fat planes small bladder calculus. Follow H&H. Transfuse for hemoglobin less than 8. Follow.  #9 nonsustained V. Tach Likely secondary to electrolyte abnormalities of hypokalemia and hypomagnesemia. No further events overnight. Continue to replete electrolytes. Continue cardizem.  #10 AAA Per daughter this is  chronic in nature and PCP follows up as outpatient.  #11 prophylaxis SCDs for DVT prophylaxis.    Code Status: DO NOT RESUSCITATE Family Communication: updated patient and daughter at bedside. Disposition Plan: SNF when medically stable.   Consultants:  Neurology: Dr.Dr. Roseanne RenoStewart 09/12/2014  Urology: Dr. Retta Dionesahlstedt 09/15/14  Procedures:  EEG 09/13/2014  Chest x-ray 09/12/2014  CT head 09/12/2014  MRI head 09/13/2014  CT abd/pelvis 09/15/14  Antibiotics:  IV Zosyn 09/12/2014>>>09/16/14  IV vancomycin 09/12/2014>>>>09/16/14  IV azithromycin 09/12/2014>>>09/13/2014  IV Rocephin 09/12/2014>>> 09/13/2014  Augmentin 09/16/14  HPI/Subjective: Patient more alert. Patient following commands. Patient's mental status is significantly improved. Patient with improved hematuria.   Objective: Filed Vitals:   09/17/14 1255  BP: 147/75  Pulse: 73  Temp: 97.7 F (36.5 C)  Resp: 20    Intake/Output Summary (Last 24 hours) at 09/17/14 1747 Last data filed at 09/17/14 0700  Gross per 24 hour  Intake    150 ml  Output    435 ml  Net   -285 ml   Filed Weights   09/14/14 0400 09/15/14 0400 09/17/14 0837  Weight: 96.4 kg (212 lb 8.4 oz) 97.8 kg (215 lb 9.8 oz) 100.1 kg (220 lb 10.9 oz)    Exam:   General:  NAD  Cardiovascular: RRR  Respiratory: CTAB anterior lung fields.  Abdomen: soft, nontender, nondistended, positive bowel sounds.  Musculoskeletal: no clubbing cyanosis or edema.  Data Reviewed: Basic Metabolic Panel:  Recent Labs Lab 09/13/14 0334 09/14/14 0345 09/14/14 0348  09/15/14 0340 09/16/14 0443 09/17/14 0404  NA 149*  --  145 144 143 142  K 2.8*  --  3.2* 3.5* 3.2* 4.2  CL 107  --  105 105 105 106  CO2 28  --  26 28 30 30   GLUCOSE 142*  --  164* 178* 117* 165*  BUN 20  --  20 16 12 12   CREATININE 1.31  --  1.13 0.98 0.86 0.88  CALCIUM 6.2*  --  6.6* 7.1* 7.4* 8.3*  MG 0.4*  --  1.3* 1.7 2.1  --   PHOS  --  3.1  --   --   --   --    Liver Function Tests:  Recent Labs Lab 09/12/14 0800 09/15/14 0340  AST 27 15  ALT 9 8  ALKPHOS 60 42  BILITOT 0.7 0.6  PROT 6.8 5.1*  ALBUMIN 3.0* 2.1*   No results for input(s): LIPASE, AMYLASE in the last 168 hours. No results for input(s): AMMONIA in the last 168 hours. CBC:  Recent Labs Lab 09/12/14 0800  09/14/14 0348 09/15/14 0340 09/15/14 1432 09/16/14 0443 09/17/14 0404  WBC 16.5*  < > 9.8 8.8 6.8 7.0 7.5  NEUTROABS 14.6*  --   --   --   --   --   --   HGB 11.4*  < > 9.3* 8.9* 9.9* 8.9* 9.4*  HCT 39.0  < > 31.3* 30.0* 33.6* 30.3* 32.3*  MCV 89.7  < > 86.7 87.0 87.7 87.6 87.8  PLT 171  < > 140* 152 171 182 186  < > = values in this interval not displayed. Cardiac Enzymes:  Recent Labs Lab 09/12/14 0800  TROPONINI 0.33*   BNP (last 3 results) No results for input(s): PROBNP in the last 8760 hours. CBG:  Recent Labs Lab 09/16/14 2314 09/17/14 0357 09/17/14 0741 09/17/14 1201 09/17/14 1604  GLUCAP 167* 144* 161* 213* 185*    Recent Results (from the past 240 hour(s))  Blood culture (routine x 2)     Status: None (Preliminary result)   Collection Time: 09/12/14  9:23 AM  Result Value Ref Range Status   Specimen Description BLOOD RIGHT ANTECUBITAL  Final   Special Requests BOTTLES DRAWN AEROBIC AND ANAEROBIC 5 CC EACH  Final   Culture  Setup Time   Final    09/12/2014 19:25 Performed at [facility]    Culture   Final           BLOOD CULTURE RECEIVED NO GROWTH TO DATE CULTURE WILL BE HELD FOR 5 DAYS BEFORE ISSUING A FINAL NEGATIVE REPORT Performed at [facility]    Report Status PENDING  Incomplete  Blood culture (routine x 2)     Status: None (Preliminary result)   Collection Time: 09/12/14  9:23 AM  Result Value Ref Range Status   Specimen Description BLOOD LEFT ANTECUBITAL  Final   Special Requests BOTTLES DRAWN AEROBIC AND ANAEROBIC 5 CC EACH  Final   Culture  Setup Time   Final    09/12/2014 19:25 Performed at [facility]    Culture   Final           BLOOD CULTURE RECEIVED NO GROWTH TO DATE CULTURE WILL BE HELD FOR 5 DAYS BEFORE ISSUING A FINAL NEGATIVE REPORT Performed at [facility]    Report Status PENDING  Incomplete  Urine culture     Status: None   Collection Time: 09/12/14 10:10 AM  Result Value Ref Range Status   Specimen Description URINE, CATHETERIZED  Final   Special Requests NONE  Final   Culture  Setup Time   Final    09/12/2014 15:54 Performed at [facility]    Colony Count NO GROWTH Performed at [facility]   Final   Culture NO GROWTH Performed at [facility]   Final   Report Status 09/13/2014 FINAL  Final  MRSA PCR Screening     Status: None   Collection Time: 09/12/14  3:47 PM  Result Value Ref Range Status   MRSA by PCR NEGATIVE NEGATIVE Final    Comment:        The GeneXpert MRSA Assay (FDA approved for NASAL specimens only), is one component of a comprehensive MRSA colonization surveillance program. It is not intended to diagnose MRSA infection nor to guide or monitor treatment for MRSA infections.   Culture, Urine     Status: None   Collection Time: 09/15/14 10:46 AM  Result Value Ref Range Status   Specimen Description URINE, CATHETERIZED  Final   Special Requests NONE  Final   Culture  Setup Time   Final    09/15/2014 14:44 Performed at [facility]    Colony Count NO GROWTH Performed at [facility]   Final   Culture NO GROWTH Performed at [facility]   Final   Report Status 09/16/2014 FINAL  Final      Studies: No results found.  Scheduled Meds: . amoxicillin-clavulanate  1 tablet Oral Q12H  . ezetimibe  10 mg Oral q1800   And  . atorvastatin  20 mg Oral q1800  . diltiazem  360 mg Oral Daily  . feeding supplement (GLUCERNA SHAKE)  237 mL Oral BID BM  . insulin aspart  0-9 Units Subcutaneous TID WC  . insulin glargine  20 Units Subcutaneous QHS  . levETIRAcetam  500 mg Oral BID  . sodium chloride  3 mL Intravenous Q12H  Continuous Infusions: . sodium chloride 10 mL/hr at 09/15/14 0101    Principal Problem:   Generalized seizures Active Problems:   Dementia   Chronic atrial fibrillation   Acute encephalopathy   PNA (pneumonia)   Hypernatremia   Hematuria, gross   Hypokalemia   Hypomagnesemia   Anemia   Acute blood loss anemia   CAP (community acquired pneumonia)   AAA (abdominal aortic aneurysm) without rupture    Time spent: 40 minutes    Marvyn Torrez M.D. [facility] Pager [phone]. If 7PM-7AM, please contact night-coverage at [email or web address], password [email or web address] 09/17/2014, 5:47 PM  LOS: 5 days

## 2014-09-17 NOTE — Progress Notes (Signed)
PHARMACY BRIEF NOTE:    DRUG INTERACTION - simvastatin + diltiazem  Patient on Vytorin (ezetimibe 10mg  - simvastatin 40mg ) once daily prior to admission.  With diltazem now added to regimen, simvastatin dosage in Vytorin exceeds maximum recommended and carries an increased risk of myopathy.  Per policy, inpatient regimen has been changed to Lipitor (atorvastatin) 20mg  daily plus Zetia (ezetimibe) 10mg  daily to avoid the drug interaction.  Diltazem was continued as ordered.  Please consider this change in therapy during discharge medication reconciliation.  Thank you,  Elie Goodyandy Jonaya Freshour, PharmD, BCPS Pager: [phone] 09/17/2014  6:28 AM

## 2014-09-18 DIAGNOSIS — R569 Unspecified convulsions: Secondary | ICD-10-CM | POA: Insufficient documentation

## 2014-09-18 DIAGNOSIS — R609 Edema, unspecified: Secondary | ICD-10-CM

## 2014-09-18 DIAGNOSIS — R197 Diarrhea, unspecified: Secondary | ICD-10-CM | POA: Diagnosis not present

## 2014-09-18 DIAGNOSIS — R600 Localized edema: Secondary | ICD-10-CM | POA: Diagnosis not present

## 2014-09-18 LAB — CULTURE, BLOOD (ROUTINE X 2)
Culture: NO GROWTH
Culture: NO GROWTH

## 2014-09-18 LAB — CBC
HCT: 34.9 % — ABNORMAL LOW (ref 39.0–52.0)
Hemoglobin: 10.2 g/dL — ABNORMAL LOW (ref 13.0–17.0)
MCH: 26.1 pg (ref 26.0–34.0)
MCHC: 29.2 g/dL — AB (ref 30.0–36.0)
MCV: 89.3 fL (ref 78.0–100.0)
PLATELETS: 223 10*3/uL (ref 150–400)
RBC: 3.91 MIL/uL — ABNORMAL LOW (ref 4.22–5.81)
RDW: 17.4 % — AB (ref 11.5–15.5)
WBC: 8 10*3/uL (ref 4.0–10.5)

## 2014-09-18 LAB — BASIC METABOLIC PANEL
Anion gap: 5 (ref 5–15)
BUN: 10 mg/dL (ref 6–23)
CO2: 32 mEq/L (ref 19–32)
CREATININE: 0.79 mg/dL (ref 0.50–1.35)
Calcium: 9.1 mg/dL (ref 8.4–10.5)
Chloride: 104 mEq/L (ref 96–112)
GFR, EST NON AFRICAN AMERICAN: 80 mL/min — AB (ref >90)
Glucose, Bld: 148 mg/dL — ABNORMAL HIGH (ref 70–99)
Potassium: 4.3 mEq/L (ref 3.7–5.3)
Sodium: 141 mEq/L (ref 137–147)

## 2014-09-18 LAB — GLUCOSE, CAPILLARY
Glucose-Capillary: 145 mg/dL — ABNORMAL HIGH (ref 70–99)
Glucose-Capillary: 145 mg/dL — ABNORMAL HIGH (ref 70–99)
Glucose-Capillary: 159 mg/dL — ABNORMAL HIGH (ref 70–99)
Glucose-Capillary: 178 mg/dL — ABNORMAL HIGH (ref 70–99)

## 2014-09-18 LAB — CLOSTRIDIUM DIFFICILE BY PCR: Toxigenic C. Difficile by PCR: NEGATIVE

## 2014-09-18 MED ORDER — TRAMADOL HCL 50 MG PO TABS
50.0000 mg | ORAL_TABLET | Freq: Four times a day (QID) | ORAL | Status: DC | PRN
  Administered 2014-09-20: 50 mg via ORAL
  Filled 2014-09-18: qty 1

## 2014-09-18 MED ORDER — PANTOPRAZOLE SODIUM 40 MG PO TBEC
40.0000 mg | DELAYED_RELEASE_TABLET | Freq: Every day | ORAL | Status: DC
  Administered 2014-09-18 – 2014-09-20 (×3): 40 mg via ORAL
  Filled 2014-09-18 (×5): qty 1

## 2014-09-18 MED ORDER — MEMANTINE HCL 10 MG PO TABS
10.0000 mg | ORAL_TABLET | Freq: Two times a day (BID) | ORAL | Status: DC
  Administered 2014-09-18 – 2014-09-20 (×4): 10 mg via ORAL
  Filled 2014-09-18 (×5): qty 1

## 2014-09-18 MED ORDER — FUROSEMIDE 40 MG PO TABS
40.0000 mg | ORAL_TABLET | Freq: Every day | ORAL | Status: DC
  Administered 2014-09-19: 40 mg via ORAL
  Filled 2014-09-18: qty 1

## 2014-09-18 MED ORDER — HYDROXYZINE HCL 25 MG PO TABS
25.0000 mg | ORAL_TABLET | Freq: Two times a day (BID) | ORAL | Status: DC | PRN
  Filled 2014-09-18: qty 1

## 2014-09-18 MED ORDER — FLUOXETINE HCL 20 MG PO CAPS
40.0000 mg | ORAL_CAPSULE | Freq: Every day | ORAL | Status: DC
  Administered 2014-09-18 – 2014-09-20 (×3): 40 mg via ORAL
  Filled 2014-09-18 (×3): qty 2

## 2014-09-18 MED ORDER — LORATADINE 10 MG PO TABS
10.0000 mg | ORAL_TABLET | Freq: Every day | ORAL | Status: DC
  Administered 2014-09-18 – 2014-09-20 (×3): 10 mg via ORAL
  Filled 2014-09-18 (×3): qty 1

## 2014-09-18 MED ORDER — FUROSEMIDE 10 MG/ML IJ SOLN
40.0000 mg | Freq: Once | INTRAMUSCULAR | Status: AC
  Administered 2014-09-18: 40 mg via INTRAVENOUS
  Filled 2014-09-18: qty 4

## 2014-09-18 MED ORDER — DOXEPIN HCL (ANTIPRURITIC) 5 % EX CREA: 1.0000 "application " | TOPICAL_CREAM | Freq: Every day | CUTANEOUS | Status: DC | PRN

## 2014-09-18 MED ORDER — POTASSIUM CHLORIDE CRYS ER 20 MEQ PO TBCR
20.0000 meq | EXTENDED_RELEASE_TABLET | Freq: Two times a day (BID) | ORAL | Status: DC
Start: 2014-09-18 — End: 2014-09-20
  Administered 2014-09-18 – 2014-09-20 (×4): 20 meq via ORAL
  Filled 2014-09-18 (×4): qty 1

## 2014-09-18 NOTE — Progress Notes (Signed)
Medicare Important Message given? YES  (If response is "NO", the following Medicare IM given date fields will be blank)  Date Medicare IM given:  09/18/2014 Medicare IM given by: Jocelyn Lowery  

## 2014-09-18 NOTE — Progress Notes (Signed)
[facility] PROGRESS NOTE  [address] ZOX:[id number] DOB: 1928/08/22 DOA: 09/12/2014 PCP: Georgann Housekeeper, MD  Admission history of present illness/brief narrative:  Glen Miller is a 80 y.o. male, with past medical history of hypertension, diabetes mellitus, COPD, and atrial fibrillation not on anticoagulation, dementia, baseline bedridden/wheelchair dependent, verbal, brought by his daughters for altered mental status, family report patient has been more confused, lethargic, less responsive over the last 24 hours, which prompted them to bring him to ED, patient was noticed to have seizures, received loading dose of IV Keppra 1000 mg, and started on 500 mg IV twice a day as per neurology recommendation, seen by neurology service, with recommendation of MRI brain, continue , EEG. No further seizures since admission. workup was significant for pneumonia, most likely aspiration as family report coughing during eating, chest x-ray showing evidence of infiltrate/opacity, patient was started on IV vancomycin twice and and Zosyn . as well patient was noticed to have hypernatremia, he has known history of A. fib, was in A. fib with RVR requiring Cardizem drip initially. Patient is being transitioned to oral Cardizem, with the Toprol Cardizem drip and transferred to telemetry floor. Agent does have multiple electrolyte abnormalities, which does require aggressive replacement, loading hypokalemia, hypomagnesemia, hypocalcemia, patient had to runs of 2 beats of nonsustained V. Tach, this is most likely related to his tract abnormality, continue to monitor on telemetry. Patient had urinary retention on first night, where he required Foley catheter.     Assessment/Plan: #1 generalized seizures Unknown etiology. Abnormal electrolytes may have played a role. CT head negative. MRI head negative. EEG negative. Patient with no further seizures. Currently on oral Keppra. Continue seizure precautions.  Continue to replete electrolytes. Continue antibiotics. Will need outpatient follow-up with neurology.  #2 acute encephalopathy Likely multifactorial in nature secondary to generalized seizures, hypernatremia, hypomagnesium, hypokalemia, infectious etiology of probable aspiration pneumonia in the setting of dementia. Clinical improvement. Continue to replete electrolytes. Continue Keppra. Continue empiric antibiotics. Follow.  #3 A. Fib with RVR Patient is no longer on the Cardizem drip. Patient's heart rate has remained stable. Continue Cardizem 360 mg daily. Metoprolol d/c'd secondary to bradycardia. Patient with hematuria and also a history of dementia likely a poor candidate for anticoagulation. Follow.  #4 pneumonia/probable aspiration pneumonia Blood cultures with no growth to date. Afebrile.clinical improvement. Continue oral Augmentin. Follow.  #5 hypernatremia Secondary to volume depletion and dehydration. Improvement with D5 half-normal saline. NSL IVF. Follow.  #6 hypernatremia/hypokalemia/hypomagnesemia/hypocalcemia Improving. Hypernatremia has improved.hypokalemia with some improvement however we'll continue to replete. Keep magnesium greater than 2.  Follow.  #7 hematuria Likely secondary to bladder irritation/trauma post catheter placement. CT of the abdomen and pelvis with some chronic inflammation, renal atrophic bilaterally, nonobstructing calculi. Hematuria improving. Patient has been seen by urology at no further workup is needed at this time. Urology following and appreciate input and recommendations.  #8 anemia/acute blood loss anemia Likely secondary to hematuria. Anemia panel pending. Hemoglobin currently at 9.4 and stable. Hematuria improving.  from 9.3. CT of the abdomen and pelvis with descending thoracic aortic aneurysm of 6.0 x 5.8 cm, no definite evidence of intramural hematoma or perianeurysmal hemorrhage, diverticulosis without diverticulitis, nonobstructing  calculi and chronic infiltration of the peripelvic and perinephric fat planes small bladder calculus. Follow H&H. Transfuse for hemoglobin less than 8. Follow.  #9 nonsustained V. Tach Likely secondary to electrolyte abnormalities of hypokalemia and hypomagnesemia. No further events overnight. Continue to replete electrolytes. Continue cardizem.  #10 diarrhea Likely antibiotic induced diarrhea.  C. difficile PCR pending.  #11 left upper extremity edema Will check Dopplers of the upper extremities to rule out DVT. Given a dose of IV Lasix. Resume home dose Lasix tomorrow.  #12 AAA Per daughter this is chronic in nature and PCP follows up as outpatient.  #11 prophylaxis SCDs for DVT prophylaxis.    Code Status: DO NOT RESUSCITATE Family Communication: updated patient and daughter at bedside. Disposition Plan: SNF when medically stable.   Consultants:  Neurology: Dr.Dr. Roseanne RenoStewart 09/12/2014  Urology: Dr. Retta Dionesahlstedt 09/15/14  Procedures:  EEG 09/13/2014  Chest x-ray 09/12/2014  CT head 09/12/2014  MRI head 09/13/2014  CT abd/pelvis 09/15/14  Antibiotics:  IV Zosyn 09/12/2014>>>09/16/14  IV vancomycin 09/12/2014>>>>09/16/14  IV azithromycin 09/12/2014>>>09/13/2014  IV Rocephin 09/12/2014>>> 09/13/2014  Augmentin 09/16/14  HPI/Subjective: Patient more alert. Patient following commands. Patient's mental status is significantly improved. Patient with improved hematuria. Patient noted to have loose stools today per daughter.  Objective: Filed Vitals:   09/18/14 1421  BP: 151/85  Pulse: 80  Temp: 97.3 F (36.3 C)  Resp: 18    Intake/Output Summary (Last 24 hours) at 09/18/14 1556 Last data filed at 09/18/14 1543  Gross per 24 hour  Intake    720 ml  Output    751 ml  Net    -31 ml   Filed Weights   09/14/14 0400 09/15/14 0400 09/17/14 0837  Weight: 96.4 kg (212 lb 8.4 oz) 97.8 kg (215 lb 9.8 oz) 100.1 kg (220 lb 10.9 oz)    Exam:   General:   NAD  Cardiovascular: RRR  Respiratory: CTAB anterior lung fields.  Abdomen: soft, nontender, nondistended, positive bowel sounds.  Musculoskeletal: no clubbing cyanosis. LUE edematous  Data Reviewed: Basic Metabolic Panel:  Recent Labs Lab 09/13/14 0334 09/14/14 0345 09/14/14 0348 09/15/14 0340 09/16/14 0443 09/17/14 0404 09/18/14 0410  NA 149*  --  145 144 143 142 141  K 2.8*  --  3.2* 3.5* 3.2* 4.2 4.3  CL 107  --  105 105 105 106 104  CO2 28  --  26 28 30 30  32  GLUCOSE 142*  --  164* 178* 117* 165* 148*  BUN 20  --  20 16 12 12 10   CREATININE 1.31  --  1.13 0.98 0.86 0.88 0.79  CALCIUM 6.2*  --  6.6* 7.1* 7.4* 8.3* 9.1  MG 0.4*  --  1.3* 1.7 2.1  --   --   PHOS  --  3.1  --   --   --   --   --    Liver Function Tests:  Recent Labs Lab 09/12/14 0800 09/15/14 0340  AST 27 15  ALT 9 8  ALKPHOS 60 42  BILITOT 0.7 0.6  PROT 6.8 5.1*  ALBUMIN 3.0* 2.1*   No results for input(s): LIPASE, AMYLASE in the last 168 hours. No results for input(s): AMMONIA in the last 168 hours. CBC:  Recent Labs Lab 09/12/14 0800  09/15/14 0340 09/15/14 1432 09/16/14 0443 09/17/14 0404 09/18/14 0410  WBC 16.5*  < > 8.8 6.8 7.0 7.5 8.0  NEUTROABS 14.6*  --   --   --   --   --   --   HGB 11.4*  < > 8.9* 9.9* 8.9* 9.4* 10.2*  HCT 39.0  < > 30.0* 33.6* 30.3* 32.3* 34.9*  MCV 89.7  < > 87.0 87.7 87.6 87.8 89.3  PLT 171  < > 152 171 182 186 223  < > = values in  this interval not displayed. Cardiac Enzymes:  Recent Labs Lab 09/12/14 0800  TROPONINI 0.33*   BNP (last 3 results) No results for input(s): PROBNP in the last 8760 hours. CBG:  Recent Labs Lab 09/17/14 1201 09/17/14 1604 09/17/14 2103 09/18/14 0741 09/18/14 1159  GLUCAP 213* 185* 179* 145* 145*    Recent Results (from the past 240 hour(s))  Blood culture (routine x 2)     Status: None   Collection Time: 09/12/14  9:23 AM  Result Value Ref Range Status   Specimen Description BLOOD RIGHT ANTECUBITAL   Final   Special Requests BOTTLES DRAWN AEROBIC AND ANAEROBIC 5 CC EACH  Final   Culture  Setup Time   Final    09/12/2014 19:25 [facility]    Culture   Final    NO GROWTH 5 DAYS [facility]    Report Status 09/18/2014 FINAL  Final  Blood culture (routine x 2)     Status: None   Collection Time: 09/12/14  9:23 AM  Result Value Ref Range Status   Specimen Description BLOOD LEFT ANTECUBITAL  Final   Special Requests BOTTLES DRAWN AEROBIC AND ANAEROBIC 5 CC EACH  Final   Culture  Setup Time   Final    09/12/2014 19:25 [facility]    Culture   Final    NO GROWTH 5 DAYS [facility]    Report Status 09/18/2014 FINAL  Final  Urine culture     Status: None   Collection Time: 09/12/14 10:10 AM  Result Value Ref Range Status   Specimen Description URINE, CATHETERIZED  Final   Special Requests NONE  Final   Culture  Setup Time   Final    09/12/2014 15:54 [facility] at [facility]    Colony Count NO GROWTH [facility]   Final   Culture NO GROWTH [facility] at [facility]   Final   Report Status 09/13/2014 FINAL  Final  MRSA PCR Screening     Status: None   Collection Time: 09/12/14  3:47 PM  Result Value Ref Range Status   MRSA by PCR NEGATIVE NEGATIVE Final    Comment:        The GeneXpert MRSA Assay (FDA approved for NASAL specimens only), is one component of a comprehensive MRSA colonization surveillance program. It is not intended to diagnose MRSA infection nor to guide or monitor treatment for MRSA infections.   Culture, Urine     Status: None   Collection Time: 09/15/14 10:46 AM  Result Value Ref Range Status   Specimen Description URINE, CATHETERIZED  Final   Special Requests NONE  Final   Culture  Setup Time   Final    09/15/2014 14:44 [facility]formed at [facility]    Colony Count NO GROWTH [facility]    Final   Culture NO GROWTH [facility]   Final   Report Status 09/16/2014 FINAL  Final     Studies: No results found.  Scheduled Meds: . amoxicillin-clavulanate  1 tablet Oral Q12H  . ezetimibe  10 mg Oral q1800   And  . atorvastatin  20 mg Oral q1800  . diltiazem  360 mg Oral Daily  . feeding supplement (GLUCERNA SHAKE)  237 mL Oral BID BM  . FLUoxetine  40 mg Oral Daily  . furosemide  40 mg Intravenous Once  . [START ON 09/19/2014]  furosemide  40 mg Oral Daily  . insulin aspart  0-9 Units Subcutaneous TID WC  . insulin glargine  20 Units Subcutaneous QHS  . levETIRAcetam  500 mg Oral BID  . loratadine  10 mg Oral Daily  . memantine  10 mg Oral BID  . pantoprazole  40 mg Oral Daily  . potassium chloride SA  20 mEq Oral BID  . sodium chloride  3 mL Intravenous Q12H   Continuous Infusions:    Principal Problem:   Generalized seizures Active Problems:   Dementia   Chronic atrial fibrillation   Acute encephalopathy   PNA (pneumonia)   Hypernatremia   Hematuria, gross   Hypokalemia   Hypomagnesemia   Anemia   Acute blood loss anemia   CAP (community acquired pneumonia)   AAA (abdominal aortic aneurysm) without rupture   Diarrhea   Edema of upper extremity    Time spent: 40 minutes    THOMPSON,DANIEL M.D. [facility] Pager [phone]. If 7PM-7AM, please contact night-coverage at [email or web address], password [email or web address] 09/18/2014, 3:56 PM  LOS: 6 days

## 2014-09-19 DIAGNOSIS — R609 Edema, unspecified: Secondary | ICD-10-CM

## 2014-09-19 LAB — BASIC METABOLIC PANEL
Anion gap: 8 (ref 5–15)
BUN: 10 mg/dL (ref 6–23)
CO2: 33 mEq/L — ABNORMAL HIGH (ref 19–32)
Calcium: 9 mg/dL (ref 8.4–10.5)
Chloride: 100 mEq/L (ref 96–112)
Creatinine, Ser: 0.85 mg/dL (ref 0.50–1.35)
GFR calc Af Amer: >90 mL/min (ref >90)
GFR, EST NON AFRICAN AMERICAN: 78 mL/min — AB (ref >90)
GLUCOSE: 169 mg/dL — AB (ref 70–99)
Potassium: 4.3 mEq/L (ref 3.7–5.3)
Sodium: 141 mEq/L (ref 137–147)

## 2014-09-19 LAB — GLUCOSE, CAPILLARY
GLUCOSE-CAPILLARY: 187 mg/dL — AB (ref 70–99)
Glucose-Capillary: 146 mg/dL — ABNORMAL HIGH (ref 70–99)
Glucose-Capillary: 175 mg/dL — ABNORMAL HIGH (ref 70–99)
Glucose-Capillary: 289 mg/dL — ABNORMAL HIGH (ref 70–99)

## 2014-09-19 LAB — CBC
HEMATOCRIT: 35.5 % — AB (ref 39.0–52.0)
Hemoglobin: 10.5 g/dL — ABNORMAL LOW (ref 13.0–17.0)
MCH: 25.8 pg — ABNORMAL LOW (ref 26.0–34.0)
MCHC: 29.6 g/dL — AB (ref 30.0–36.0)
MCV: 87.2 fL (ref 78.0–100.0)
Platelets: 243 10*3/uL (ref 150–400)
RBC: 4.07 MIL/uL — ABNORMAL LOW (ref 4.22–5.81)
RDW: 17.3 % — ABNORMAL HIGH (ref 11.5–15.5)
WBC: 10.3 10*3/uL (ref 4.0–10.5)

## 2014-09-19 MED ORDER — FUROSEMIDE 40 MG PO TABS
40.0000 mg | ORAL_TABLET | Freq: Every day | ORAL | Status: DC
  Administered 2014-09-20: 40 mg via ORAL
  Filled 2014-09-19: qty 1

## 2014-09-19 MED ORDER — LOPERAMIDE HCL 2 MG PO CAPS: 2.0000 mg | ORAL_CAPSULE | ORAL | Status: DC | PRN

## 2014-09-19 MED ORDER — FUROSEMIDE 10 MG/ML IJ SOLN
40.0000 mg | Freq: Once | INTRAMUSCULAR | Status: AC
  Administered 2014-09-19: 40 mg via INTRAVENOUS
  Filled 2014-09-19: qty 4

## 2014-09-19 NOTE — Clinical Social Work Note (Signed)
CSW reviewed Md note dated 09/18/14 for possible discharge  MD note dated 09/18/14 states pt not stable for discharge today  CSW will continue to monitor for pt needs until discharge  .Glen Bubaegina Jaeline Whobrey, LCSW [facility] Clinical Social Work[facility] - Weekend Coverage cell #: [phone]

## 2014-09-19 NOTE — Progress Notes (Signed)
[facility] PROGRESS NOTE  Festus BarrenSamuel V Bardales EAV:[id number]:[id number] DOB: [birth year] DOA: 09/12/2014 PCP: Georgann HousekeeperHUSAIN,KARRAR, MD  Admission history of present illness/brief narrative:  Glen Miller is a 80 y.o. male, with past medical history of hypertension, diabetes mellitus, COPD, and atrial fibrillation not on anticoagulation, dementia, baseline bedridden/wheelchair dependent, verbal, brought by his daughters for altered mental status, family report patient has been more confused, lethargic, less responsive over the last 24 hours, which prompted them to bring him to ED, patient was noticed to have seizures, received loading dose of IV Keppra 1000 mg, and started on 500 mg IV twice a day as per neurology recommendation, seen by neurology service, with recommendation of MRI brain, continue , EEG. No further seizures since admission. workup was significant for pneumonia, most likely aspiration as family report coughing during eating, chest x-ray showing evidence of infiltrate/opacity, patient was started on IV vancomycin twice and and Zosyn . as well patient was noticed to have hypernatremia, he has known history of A. fib, was in A. fib with RVR requiring Cardizem drip initially. Patient is being transitioned to oral Cardizem, with the Toprol Cardizem drip and transferred to telemetry floor. Agent does have multiple electrolyte abnormalities, which does require aggressive replacement, loading hypokalemia, hypomagnesemia, hypocalcemia, patient had to runs of 2 beats of nonsustained V. Tach, this is most likely related to his tract abnormality, continue to monitor on telemetry. Patient had urinary retention on first night, where he required Foley catheter.     Assessment/Plan: #1 generalized seizures Unknown etiology. Abnormal electrolytes may have played a role. CT head negative. MRI head negative. EEG negative. Patient with no further seizures. Currently on oral Keppra. Continue seizure precautions.  Continue to replete electrolytes. Continue antibiotics. Will need outpatient follow-up with neurology.  #2 acute encephalopathy Likely multifactorial in nature secondary to generalized seizures, hypernatremia, hypomagnesium, hypokalemia, infectious etiology of probable aspiration pneumonia in the setting of dementia. Clinical improvement. Continue to replete electrolytes. Continue Keppra. Continue empiric antibiotics. Follow.  #3 A. Fib with RVR Patient is no longer on the Cardizem drip. Patient's heart rate has remained stable. Continue Cardizem 360 mg daily. Metoprolol d/c'd secondary to bradycardia. Patient with hematuria and also a history of dementia likely a poor candidate for anticoagulation. Follow.  #4 pneumonia/probable aspiration pneumonia Blood cultures with no growth to date. Afebrile.clinical improvement. Continue oral Augmentin. Follow.  #5 hypernatremia Secondary to volume depletion and dehydration. Improvement with D5 half-normal saline. NSL IVF. Follow.  #6 hypernatremia/hypokalemia/hypomagnesemia/hypocalcemia Improving. Hypernatremia has improved. Hypokalemia repleted. Keep magnesium greater than 2.  Follow.  #7 hematuria Likely secondary to bladder irritation/trauma post catheter placement. CT of the abdomen and pelvis with some chronic inflammation, renal atrophic bilaterally, nonobstructing calculi. Hematuria improved. Patient has been seen by urology and no further workup is needed at this time. Urology following and appreciate input and recommendations.  #8 anemia/acute blood loss anemia Likely secondary to hematuria. Anemia panel pending. Hemoglobin currently at 10.5 and stable. Hematuria improving. CT of the abdomen and pelvis with descending thoracic aortic aneurysm of 6.0 x 5.8 cm, no definite evidence of intramural hematoma or perianeurysmal hemorrhage, diverticulosis without diverticulitis, nonobstructing calculi and chronic infiltration of the peripelvic and  perinephric fat planes small bladder calculus. Follow H&H. Transfuse for hemoglobin less than 8. Follow.  #9 nonsustained V. Tach Likely secondary to electrolyte abnormalities of hypokalemia and hypomagnesemia. No further events overnight. Continue to replete electrolytes. Continue cardizem.  #10 diarrhea Likely antibiotic induced diarrhea. C. difficile PCR negative.  #11 left upper extremity  edema Secondary to volume overload. Dopplers of the upper extremities negative for DVT. Given another dose of IV Lasix. Resume home dose Lasix tomorrow.  #12 AAA Per daughter this is chronic in nature and PCP follows up as outpatient.  #11 prophylaxis SCDs for DVT prophylaxis.    Code Status: DO NOT RESUSCITATE Family Communication: updated patient and daughter at bedside. Disposition Plan: SNF when medically stable, hopefully in 1-2 days   Consultants:  Neurology: Dr.Dr. Roseanne RenoStewart 09/12/2014  Urology: Dr. Retta Dionesahlstedt 09/15/14  Procedures:  EEG 09/13/2014  Chest x-ray 09/12/2014  CT head 09/12/2014  MRI head 09/13/2014  CT abd/pelvis 09/15/14  UE dopplers 09/18/14  Antibiotics:  IV Zosyn 09/12/2014>>>09/16/14  IV vancomycin 09/12/2014>>>>09/16/14  IV azithromycin 09/12/2014>>>09/13/2014  IV Rocephin 09/12/2014>>> 09/13/2014  Augmentin 09/16/14  HPI/Subjective: Patient more alert. Patient following commands. Patient's mental status is significantly improved. Patient with improved hematuria.   Objective: Filed Vitals:   09/19/14 1418  BP: 135/70  Pulse: 78  Temp: 97.9 F (36.6 C)  Resp: 20    Intake/Output Summary (Last 24 hours) at 09/19/14 1708 Last data filed at 09/19/14 1422  Gross per 24 hour  Intake    120 ml  Output   2050 ml  Net  -1930 ml   Filed Weights   09/14/14 0400 09/15/14 0400 09/17/14 0837  Weight: 96.4 kg (212 lb 8.4 oz) 97.8 kg (215 lb 9.8 oz) 100.1 kg (220 lb 10.9 oz)    Exam:   General:  NAD  Cardiovascular: RRR  Respiratory:  CTAB anterior lung fields.  Abdomen: soft, nontender, nondistended, positive bowel sounds.  Musculoskeletal: no clubbing cyanosis. LUE less edematous  Data Reviewed: Basic Metabolic Panel:  Recent Labs Lab 09/13/14 0334 09/14/14 0345 09/14/14 0348 09/15/14 0340 09/16/14 0443 09/17/14 0404 09/18/14 0410 09/19/14 0425  NA 149*  --  145 144 143 142 141 141  K 2.8*  --  3.2* 3.5* 3.2* 4.2 4.3 4.3  CL 107  --  105 105 105 106 104 100  CO2 28  --  26 28 30 30  32 33*  GLUCOSE 142*  --  164* 178* 117* 165* 148* 169*  BUN 20  --  20 16 12 12 10 10   CREATININE 1.31  --  1.13 0.98 0.86 0.88 0.79 0.85  CALCIUM 6.2*  --  6.6* 7.1* 7.4* 8.3* 9.1 9.0  MG 0.4*  --  1.3* 1.7 2.1  --   --   --   PHOS  --  3.1  --   --   --   --   --   --    Liver Function Tests:  Recent Labs Lab 09/15/14 0340  AST 15  ALT 8  ALKPHOS 42  BILITOT 0.6  PROT 5.1*  ALBUMIN 2.1*   No results for input(s): LIPASE, AMYLASE in the last 168 hours. No results for input(s): AMMONIA in the last 168 hours. CBC:  Recent Labs Lab 09/15/14 1432 09/16/14 0443 09/17/14 0404 09/18/14 0410 09/19/14 0425  WBC 6.8 7.0 7.5 8.0 10.3  HGB 9.9* 8.9* 9.4* 10.2* 10.5*  HCT 33.6* 30.3* 32.3* 34.9* 35.5*  MCV 87.7 87.6 87.8 89.3 87.2  PLT 171 182 186 223 243   Cardiac Enzymes: No results for input(s): CKTOTAL, CKMB, CKMBINDEX, TROPONINI in the last 168 hours. BNP (last 3 results) No results for input(s): PROBNP in the last 8760 hours. CBG:  Recent Labs Lab 09/18/14 1658 09/18/14 2201 09/19/14 0801 09/19/14 1202 09/19/14 1644  GLUCAP 159* 178* 146* 175*  187*    Recent Results (from the past 240 hour(s))  Blood culture (routine x 2)     Status: None   Collection Time: 09/12/14  9:23 AM  Result Value Ref Range Status   Specimen Description BLOOD RIGHT ANTECUBITAL  Final   Special Requests BOTTLES DRAWN AEROBIC AND ANAEROBIC 5 CC EACH  Final   Culture  Setup Time   Final    09/12/2014 19:25 Performed at  [facility]    Culture   Final    NO GROWTH 5 DAYS Performed at [facility]    Report Status 09/18/2014 FINAL  Final  Blood culture (routine x 2)     Status: None   Collection Time: 09/12/14  9:23 AM  Result Value Ref Range Status   Specimen Description BLOOD LEFT ANTECUBITAL  Final   Special Requests BOTTLES DRAWN AEROBIC AND ANAEROBIC 5 CC EACH  Final   Culture  Setup Time   Final    09/12/2014 19:25 Performed at [facility]    Culture   Final    NO GROWTH 5 DAYS Performed at [facility]    Report Status 09/18/2014 FINAL  Final  Urine culture     Status: None   Collection Time: 09/12/14 10:10 AM  Result Value Ref Range Status   Specimen Description URINE, CATHETERIZED  Final   Special Requests NONE  Final   Culture  Setup Time   Final    09/12/2014 15:54 Performed at [facility]    Colony Count NO GROWTH Performed at [facility]   Final   Culture NO GROWTH Performed at [facility]   Final   Report Status 09/13/2014 FINAL  Final  MRSA PCR Screening     Status: None   Collection Time: 09/12/14  3:47 PM  Result Value Ref Range Status   MRSA by PCR NEGATIVE NEGATIVE Final    Comment:        The GeneXpert MRSA Assay (FDA approved for NASAL specimens only), is one component of a comprehensive MRSA colonization surveillance program. It is not intended to diagnose MRSA infection nor to guide or monitor treatment for MRSA infections.   Culture, Urine     Status: None   Collection Time: 09/15/14 10:46 AM  Result Value Ref Range Status   Specimen Description URINE, CATHETERIZED  Final   Special Requests NONE  Final   Culture  Setup Time   Final    09/15/2014 14:44 Performed at [facility]    Colony Count NO GROWTH Performed at [facility]   Final   Culture NO GROWTH Performed at [facility]   Final   Report Status 09/16/2014 FINAL  Final  Clostridium Difficile by PCR      Status: None   Collection Time: 09/18/14  2:15 PM  Result Value Ref Range Status   C difficile by pcr NEGATIVE NEGATIVE Final    Comment: Performed at [facility]     Studies: No results found.  Scheduled Meds: . amoxicillin-clavulanate  1 tablet Oral Q12H  . ezetimibe  10 mg Oral q1800   And  . atorvastatin  20 mg Oral q1800  . diltiazem  360 mg Oral Daily  . feeding supplement (GLUCERNA SHAKE)  237 mL Oral BID BM  . FLUoxetine  40 mg Oral Daily  . furosemide  40 mg Oral Daily  . insulin aspart  0-9 Units Subcutaneous TID WC  . insulin glargine  20 Units Subcutaneous QHS  . levETIRAcetam  500 mg Oral BID  . loratadine  10 mg Oral Daily  . memantine  10 mg Oral BID  . pantoprazole  40 mg Oral Daily  . potassium chloride SA  20 mEq Oral BID  . sodium chloride  3 mL Intravenous Q12H   Continuous Infusions:    Principal Problem:   Generalized seizures Active Problems:   Dementia   Chronic atrial fibrillation   Acute encephalopathy   PNA (pneumonia)   Hypernatremia   Hematuria, gross   Hypokalemia   Hypomagnesemia   Anemia   Acute blood loss anemia   CAP (community acquired pneumonia)   AAA (abdominal aortic aneurysm) without rupture   Diarrhea   Edema of upper extremity   Seizure    Time spent: 40 minutes    THOMPSON,DANIEL M.D. [facility] Pager [phone]. If 7PM-7AM, please contact night-coverage at [email or web address], password [facility] 09/19/2014, 5:08 PM  LOS: 7 days

## 2014-09-19 NOTE — Progress Notes (Signed)
VASCULAR LAB PRELIMINARY  PRELIMINARY  PRELIMINARY  PRELIMINARY  Bilateral upper extremity venous Dopplers completed.    Preliminary report:  There is no evidence of DVT or SVT noted in the bilateral upper extremities.   Treyvonne Tata, RVT 09/19/2014, 10:36 AM

## 2014-09-20 DIAGNOSIS — E0865 Diabetes mellitus due to underlying condition with hyperglycemia: Secondary | ICD-10-CM

## 2014-09-20 LAB — BASIC METABOLIC PANEL
ANION GAP: 7 (ref 5–15)
BUN: 13 mg/dL (ref 6–23)
CO2: 33 mEq/L — ABNORMAL HIGH (ref 19–32)
CREATININE: 0.96 mg/dL (ref 0.50–1.35)
Calcium: 9.1 mg/dL (ref 8.4–10.5)
Chloride: 99 mEq/L (ref 96–112)
GFR calc non Af Amer: 74 mL/min — ABNORMAL LOW (ref >90)
GFR, EST AFRICAN AMERICAN: 86 mL/min — AB (ref >90)
Glucose, Bld: 153 mg/dL — ABNORMAL HIGH (ref 70–99)
POTASSIUM: 4.9 meq/L (ref 3.7–5.3)
Sodium: 139 mEq/L (ref 137–147)

## 2014-09-20 LAB — GLUCOSE, CAPILLARY
GLUCOSE-CAPILLARY: 162 mg/dL — AB (ref 70–99)
Glucose-Capillary: 155 mg/dL — ABNORMAL HIGH (ref 70–99)

## 2014-09-20 MED ORDER — ISOSORBIDE MONONITRATE ER 30 MG PO TB24: 30.0000 mg | ORAL_TABLET | Freq: Every day | ORAL | Status: DC

## 2014-09-20 MED ORDER — DILTIAZEM HCL ER COATED BEADS 360 MG PO CP24: 360.0000 mg | ORAL_CAPSULE | Freq: Every day | ORAL | Status: AC

## 2014-09-20 MED ORDER — GLUCERNA SHAKE PO LIQD: 237.0000 mL | Freq: Two times a day (BID) | ORAL | Status: DC

## 2014-09-20 MED ORDER — TRAMADOL HCL 50 MG PO TABS: 50.0000 mg | ORAL_TABLET | Freq: Four times a day (QID) | ORAL | Status: DC | PRN

## 2014-09-20 MED ORDER — LEVETIRACETAM 500 MG PO TABS: 500.0000 mg | ORAL_TABLET | Freq: Two times a day (BID) | ORAL | Status: AC

## 2014-09-20 MED ORDER — OXYCODONE-ACETAMINOPHEN 5-325 MG PO TABS: 1.0000 | ORAL_TABLET | ORAL | Status: DC | PRN

## 2014-09-20 NOTE — Progress Notes (Signed)
[facility]. Patient & daughter, Glen Miller at bedside aware. Discharge packet given to RN, Florentina AddisonKatie. [facility] called for transport to pickup at 2:00pm.     Clinical Social Work Department CLINICAL SOCIAL WORK PLACEMENT NOTE 09/20/2014  Patient:  Glen Miller,Glen Miller  Account Number:  [id number] Admit date:  09/12/2014  Clinical Social Worker:  Orpah GreekKELLY FOLEY, LCSWA  Date/time:  09/17/2014 03:10 PM  Clinical Social Work is seeking post-discharge placement for this patient at the following level of care:   SKILLED NURSING   (*CSW will update this form in Epic as items are completed)   09/17/2014  Patient/family provided with [facility] Department of Clinical Social Work's list of facilities offering this level of care within the geographic area requested by the patient (or if unable, by the patient's family).  09/17/2014  Patient/family informed of their freedom to choose among providers that offer the needed level of care, that participate in Medicare, Medicaid or managed care program needed by the patient, have an available bed and are willing to accept the patient.  09/17/2014  Patient/family informed of [facility]' ownership interest in [facility], as well as of the fact that they are under no obligation to receive care at this facility.  PASARR submitted to EDS on 09/17/2014 PASARR number received on 09/17/2014  FL2 transmitted to all facilities in geographic area requested by pt/family on  09/17/2014 FL2 transmitted to all facilities within larger geographic area on   Patient informed that his/her managed care company has contracts with or will negotiate with  certain facilities, including the following:     Patient/family informed of bed offers received:  09/17/2014 [facility] Physician recommends and patient chooses bed at    [facility] on   09/20/2014 Patient to be transferred to facility by [facility] Patient and family notified of transfer on 09/20/2014 Name of family member notified:  patient's daughter, Glen Miller at bedside  The following physician request were entered in Epic:   Additional Comments:    Lincoln MaxinKelly Ruthann Angulo, LCSW [facility] Clinical Social Worker cell #: [phone]

## 2014-09-20 NOTE — Discharge Summary (Signed)
Physician Discharge Summary  Glen Miller WUJ:[id number] DOB: Jan 01, 1928 DOA: 09/12/2014  PCP: Georgann Housekeeper, MD  Admit date: 09/12/2014 Discharge date: 09/20/2014  Time spent: 70 minutes  Recommendations for Outpatient Follow-up:  1. Follow-up with neurology in 2-3 weeks for follow-up on seizures. 2. Follow up with M.D. at skilled nursing facility. Patient will likely need a basic metabolic profile and a CBC done one week post discharge.  Discharge Diagnoses:  Principal Problem:   Generalized seizures Active Problems:   Dementia   Chronic atrial fibrillation   Acute encephalopathy   PNA (pneumonia)   Hypernatremia   Hematuria, gross   Hypokalemia   Hypomagnesemia   Anemia   Acute blood loss anemia   CAP (community acquired pneumonia)   AAA (abdominal aortic aneurysm) without rupture   Diarrhea   Edema of upper extremity   Seizure   Discharge Condition: Stable and improved  Diet recommendation: Heart healthy/carb modified dysphagia 3 diet  Filed Weights   09/14/14 0400 09/15/14 0400 09/17/14 0837  Weight: 96.4 kg (212 lb 8.4 oz) 97.8 kg (215 lb 9.8 oz) 100.1 kg (220 lb 10.9 oz)    History of present illness:  Glen Miller is a 80 y.o. male, with past medical history of hypertension, diabetes mellitus, COPD, and atrial fibrillation not on anticoagulation, dementia, baseline bedridden/wheelchair dependent, verbal, brought by his daughters for altered mental status, family reported, patient has been more confused, lethargic, less responsive over the last 24 hours, which prompted them to bring him to ED. In ED patient was noticed to have seizures, given IV Ativan, and loading dose of IV Keppra 1000 mg, seen by neurology service, with recommendation of MRI brain, continue Keppra, EEG, and workup was significant for pneumonia, most likely aspiration as family report coughing during eating, chest x-ray showing evidence of infiltrate/opacity, as well patient was noticed to have  hypernatremia, he has known history of A. fib, was in A. fib with RVR requiring Cardizem drip.     Hospital Course:  #1 generalized seizures Patient had presented with altered mental status increased lethargy and confusion. In the emergency room patient was noted to have seizures. Patient was given some IV Ativan and loaded with IV Keppra. Neurology consultation was obtained and patient was followed by neurology during the hospitalization. Patient was also noted to have electrolyte abnormalities. CT head negative. MRI head negative. EEG negative. Patient with no further seizures during the hospitalization. Patient was subsequently transitioned from IV Keppra to oral Keppra which she tolerated. Patient was placed on seizure precautions. Patient's electrolyte abnormalities were corrected. Patient improved clinically was back to baseline by day of discharge. Patient be discharged on oral Keppra and patient will need outpatient follow-up with neurology.  #2 acute encephalopathy Likely multifactorial in nature secondary to generalized seizures, hypernatremia, hypomagnesium, hypokalemia, infectious etiology of probable aspiration pneumonia in the setting of dementia. Patient was stabilized and started on IV Keppra for his seizures and seen by neurology during the hospitalization. Patient was subsequently transitioned to oral Keppra did not have any further seizures. Patient's electrolyte abnormalities were corrected and patient was also placed empirically on IV antibiotics for pneumonia. Patient improved clinically was back to baseline by day of discharge.   #3 A. Fib with RVR Patient on admission was noted to be in A. fib with RVR and placed on a Cardizem drip. Patient's heart rate improved and patient was subsequently transitioned to oral Cardizem. Patient was also placed on metoprolol secondary to a few runs of nonsustained V. tach.  Metoprolol was discontinued secondary to bradycardia. Patient's heart rate  remained well-controlled on oral Cardizem. Patient subsequently converted to normal sinus rhythm. Patient was deemed prior to admission to be a poor candidate for anticoagulation secondary to his dementia. Patient will be discharged in stable and improved condition.   #4 pneumonia/probable aspiration pneumonia On admission, patient was noted to have a pneumonia which was felt to likely be aspiration pneumonia. Patient was pancultured with no growth to date. Patient was initially placed empirically on IV Rocephin IV azithromycin which was subsequently changed to IV vancomycin and IV Zosyn. Patient improved clinically. Patient remained afebrile. Patient was subsequently transitioned to oral Augmentin and completed 7 day course of antibiotic therapy during the hospitalization. Patient will not need any further antibiotics on discharge.  #5 hypernatremia Secondary to volume depletion and dehydration. Improvemed with D5 half-normal saline. Had resolved by day of discharge.   #6 hypernatremia/hypokalemia/hypomagnesemia/hypocalcemia Improving. Hypernatremia has improved. Hypokalemia repleted. Keep magnesium greater than 2. Follow.  #7 hematuria Likely secondary to bladder irritation/trauma post catheter placement. CT of the abdomen and pelvis with some chronic inflammation, renal atrophic bilaterally, nonobstructing calculi. Hematuria improved. Patient has been seen by urology and no further workup is needed at this time. Patient's hematuria improved and had resolved by day of discharge. Foley catheter was removed and patient was voiding.  #8 anemia/acute blood loss anemia During the hospitalization patient was noted to be anemic. Patient was also noted to have hematuria that time which was felt to be secondary to traumatic Foley catheter placement. Patient did not have any overt bleeding. Likely secondary to hematuria. Patient's hemoglobin stabilized after his hematuria had resolved. CT of the abdomen and  pelvis with descending thoracic aortic aneurysm of 6.0 x 5.8 cm, no definite evidence of intramural hematoma or perianeurysmal hemorrhage, diverticulosis without diverticulitis, nonobstructing calculi and chronic infiltration of the peripelvic and perinephric fat planes small bladder calculus. Patient's hemoglobin remained stable and on day of discharge patient's hemoglobin was 10.5. Outpatient follow-up.   #9 nonsustained V. Tach Likely secondary to electrolyte abnormalities of hypokalemia and hypomagnesemia. No further events overnight. Patient was initially placed on metoprolol as well as Cardizem. Patient's electrolytes were repleted. Patient did not have any further runs of nonsustained V. tach. Beta blocker was discontinued as patient was noted to be bradycardic. Patient was maintained on Cardizem .  #10 diarrhea Likely antibiotic induced diarrhea. C. difficile PCR negative. Patient was placed on Imodium as needed.  #11 left upper extremity edema Secondary to volume overload. Dopplers of the upper extremities negative for DVT. Patient given 2 doses of IV Lasix with significant improvement in left upper extremity edema. Patient has been resumed on his home regimen of oral Lasix.   #12 AAA Per daughter this is chronic in nature and PCP follows up as outpatient.  Procedures:  EEG 09/13/2014  Chest x-ray 09/12/2014  CT head 09/12/2014  MRI head 09/13/2014  CT abd/pelvis 09/15/14  UE dopplers 09/18/14    Consultations:  Neurology: Dr.Dr. Roseanne RenoStewart 09/12/2014  Urology: Dr. Retta Dionesahlstedt 09/15/14  Discharge Exam: Filed Vitals:   09/20/14 0430  BP: 161/75  Pulse: 76  Temp: 97.4 F (36.3 C)  Resp: 20    General: NAD Cardiovascular: RRR Respiratory: CTAB anterior lung fields  Discharge Instructions   Discharge Instructions    Diet - low sodium heart healthy    Complete by:  As directed   Dysphagia 3     Diet Carb Modified    Complete by:  As directed  Dysphagia 3      Discharge instructions    Complete by:  As directed   Follow up with neurology in 2-3 weeks. Follow up with MD at SNF.     Increase activity slowly    Complete by:  As directed           Current Discharge Medication List    START taking these medications   Details  diltiazem (CARDIZEM CD) 360 MG 24 hr capsule Take 1 capsule (360 mg total) by mouth daily. Qty: 31 capsule, Refills: 0    feeding supplement, GLUCERNA SHAKE, (GLUCERNA SHAKE) LIQD Take 237 mLs by mouth 2 (two) times daily between meals. Refills: 0    levETIRAcetam (KEPPRA) 500 MG tablet Take 1 tablet (500 mg total) by mouth 2 (two) times daily. Qty: 62 tablet, Refills: 0      CONTINUE these medications which have CHANGED   Details  isosorbide mononitrate (IMDUR) 30 MG 24 hr tablet Take 1 tablet (30 mg total) by mouth daily. Qty: 31 tablet, Refills: 0    oxyCODONE-acetaminophen (PERCOCET/ROXICET) 5-325 MG per tablet Take 1 tablet by mouth every 4 (four) hours as needed for severe pain. Qty: 15 tablet, Refills: 0    traMADol (ULTRAM) 50 MG tablet Take 1 tablet (50 mg total) by mouth every 6 (six) hours as needed. Qty: 20 tablet, Refills: 0      CONTINUE these medications which have NOT CHANGED   Details  acetaminophen (TYLENOL) 500 MG tablet Take 1,000 mg by mouth every 6 (six) hours as needed. For pain    Calcium Carbonate-Vitamin D (CALCIUM 600/VITAMIN D) 600-400 MG-UNIT per tablet Take 1 tablet by mouth 2 (two) times daily.    celecoxib (CELEBREX) 200 MG capsule Take 200 mg by mouth daily.    cetirizine (ZYRTEC) 10 MG tablet Take 10 mg by mouth at bedtime.    clobetasol cream (TEMOVATE) 0.05 % Apply 1 application topically 2 (two) times daily as needed (skin outbreak).     econazole nitrate 1 % cream Apply topically daily. Apply to affected area twice daily. Qty: 60 g, Refills: 5    ezetimibe-simvastatin (VYTORIN) 10-40 MG per tablet Take 1 tablet by mouth daily.    FLUoxetine (PROZAC) 20 MG capsule  Take 40 mg by mouth daily.    furosemide (LASIX) 40 MG tablet Take 40 mg by mouth daily.    gabapentin (NEURONTIN) 400 MG capsule Take 400 mg by mouth 2 (two) times daily.    hydrOXYzine (ATARAX/VISTARIL) 25 MG tablet Take 25 mg by mouth 2 (two) times daily as needed for itching (itching).     insulin glargine (LANTUS) 100 UNIT/ML injection Inject 30 Units into the skin at bedtime.    magnesium oxide (MAG-OX) 400 MG tablet Take 400 mg by mouth daily.    memantine (NAMENDA) 10 MG tablet Take 10 mg by mouth 2 (two) times daily.    metFORMIN (GLUCOPHAGE) 500 MG tablet Take 500 mg by mouth 2 (two) times daily with a meal.    nitroGLYCERIN (NITROLINGUAL) 0.4 MG/SPRAY spray Place 1 spray under the tongue every 5 (five) minutes as needed. For chest pain    omeprazole (PRILOSEC) 20 MG capsule Take 20 mg by mouth daily.    potassium chloride SA (K-DUR,KLOR-CON) 20 MEQ tablet Take 20 mEq by mouth 2 (two) times daily.    promethazine (PHENERGAN) 25 MG tablet Take 25 mg by mouth every 6 (six) hours as needed for nausea.     SPIRIVA HANDIHALER 18 MCG  inhalation capsule Place 18 mcg into inhaler and inhale daily as needed (copd).     Tamsulosin HCl (FLOMAX) 0.4 MG CAPS Take 0.4 mg by mouth every evening.    triamcinolone cream (KENALOG) 0.1 % Apply 1 application topically 2 (two) times daily as needed (skin outbreak).     ZONALON 5 % cream Apply 1 application topically daily as needed for itching (back).       STOP taking these medications     losartan (COZAAR) 50 MG tablet      metoprolol succinate (TOPROL-XL) 25 MG 24 hr tablet      predniSONE (DELTASONE) 10 MG tablet        No Known Allergies Follow-up Information    Follow up with [facility]. Schedule an appointment as soon as possible for a visit in 2 weeks.   Why:  f/u in 2-3 weeks.   Contact information:   [address] [place] [id number] [phone]      Please follow up.   Why:  f/u  with MD at SNF       The results of significant diagnostics from this hospitalization (including imaging, microbiology, ancillary and laboratory) are listed below for reference.    Significant Diagnostic Studies: Ct Abdomen Pelvis Wo Contrast  09/15/2014   CLINICAL DATA:  Gross hematuria, anemia, history kidney stones, hypertension, diabetes, atrial fibrillation, COPD  EXAM: CT ABDOMEN AND PELVIS WITHOUT CONTRAST  TECHNIQUE: Multidetector CT imaging of the abdomen and pelvis was performed following the standard protocol without IV contrast.  COMPARISON:  09/25/2005  FINDINGS: Bibasilar atelectasis and small LEFT pleural effusion.  Aneurysmal dilatation of the descending thoracic aorta, 5.7 cm transverse at the proximal descending aorta, 6.0 x 5.8 cm diameter at mid descending aorta, and 5.0 cm at distal ascending aorta.  Scattered atherosclerotic changes throughout abdominal aorta, iliac and coronary arteries.  No evidence of intramural hematoma or perianeurysmal hemorrhage.  BILATERAL minimal collecting system dilatation with significant chronic perinephric and peripelvic edema at both kidneys.  Tiny nonobstructing BILATERAL renal calculi.  No definite ureteral calcification or dilatation.  Gallbladder surgically absent.  Liver, spleen, pancreas, and adrenal glands unremarkable for technique.  Normal appendix.  Foley catheter decompresses urinary bladder with note of a tiny amount of high attenuation material adjacent to the Foley catheter balloon, question tiny bladder calculus.  Distal colonic diverticulosis without evidence of diverticulitis.  Stomach and bowel loops otherwise grossly unremarkable for technique.  Low-attenuation circulating blood question anemia.  Small supraumbilical hernia containing fat.  Tiny umbilical and RIGHT inguinal hernia containing fat.  Bones demineralized with multilevel degenerative disc disease changes of the thoracolumbar spine.  IMPRESSION: Significant atherosclerotic  changes with aneurysmal dilatation of the descending thoracic aorta achieving greatest size of 6.0 x 5.8 cm at the mid descending aorta.  No definite evidence of intramural hematoma or perianeurysmal hemorrhage.  Minimal chronic collecting system dilatation in both kidneys with tiny nonobstructing calculi and chronic infiltration of peripelvic and perinephric fat planes, stable since 2006, question sequela of previous inflammation or hemorrhage.  Questionable small bladder calculus.  Supraumbilical, umbilical and RIGHT inguinal hernias containing fat.  Sigmoid diverticulosis without diverticulitis.   Electronically Signed   By: Ulyses SouthwardMark  Boles M.D.   On: 09/15/2014 16:06   Ct Head Wo Contrast  09/12/2014   CLINICAL DATA:  behavioral changes x 1 day.Nurse note: EMS reports pt lives at home, increased confusion since yesterday, history of dementia, increased confusion which often happens with his frequent  UTI"s . Decreased sats high 70's  EXAM: CT HEAD WITHOUT CONTRAST  TECHNIQUE: Contiguous axial images were obtained from the base of the skull through the vertex without intravenous contrast.  COMPARISON:  12/03/2011  FINDINGS: Atherosclerotic and physiologic intracranial calcifications. Diffuse parenchymal atrophy. Patchy areas of hypoattenuation in deep and periventricular white matter bilaterally. Negative for acute intracranial hemorrhage, mass lesion, acute infarction, midline shift, or mass-effect. Acute infarct may be inapparent on noncontrast CT. Ventricles and sulci symmetric. Bone windows demonstrate no focal lesion.  IMPRESSION: 1. Negative for bleed or other acute intracranial process. 2. Stable atrophy and nonspecific white matter changes.   Electronically Signed   By: Oley Balm M.D.   On: 09/12/2014 08:41   Mr Brain Wo Contrast  09/13/2014   CLINICAL DATA:  Involuntary tremors. Altered mental status with dementia, Initial encounter. The exam was truncated prematurely due to low O2 saturation.   EXAM: MRI HEAD WITHOUT CONTRAST  TECHNIQUE: Multiplanar, multiecho pulse sequences of the brain and surrounding structures were obtained without intravenous contrast.  COMPARISON:  CT head 09/12/2014  FINDINGS: The patient was unable to remain motionless for the exam. Small or subtle lesions could be overlooked.  No restricted diffusion, or visible acute/chronic hemorrhage. Generalized atrophy. T2 and FLAIR hyperintensity of the periventricular and subcortical white matter, also affecting the brainstem, likely chronic microvascular ischemic change. Hydrocephalus ex vacuo. No extra-axial fluid. Grossly negative orbits and sinuses.  IMPRESSION: Truncated, motion degraded exam, demonstrating no definite acute intracranial findings. Good general agreement with yesterday CT scan.   Electronically Signed   By: Davonna Belling M.D.   On: 09/13/2014 10:49   Dg Chest Portable 1 View  09/12/2014   CLINICAL DATA:  SOb tody; dementia; hx HTN; ex smoker; diabetic  EXAM: PORTABLE CHEST - 1 VIEW  COMPARISON:  12/03/2011  FINDINGS: Dilated aortic arch and descending thoracic aorta. Previous median sternotomy. Stable cardiomegaly.  Patchy airspace opacities in the lingula or left lower lung, new since previous exam. No effusion.  IMPRESSION: 1. New left lower lung airspace disease suggesting pneumonia. 2. Thoracic aortic aneurysm. Consider thoracic MRA (lower radiation risk, can be performed noncontrast in the setting of renal dysfunction) or CTA ( higher spatial resolution) for further characterization.   Electronically Signed   By: Oley Balm M.D.   On: 09/12/2014 08:45    Microbiology: Recent Results (from the past 240 hour(s))  Blood culture (routine x 2)     Status: None   Collection Time: 09/12/14  9:23 AM  Result Value Ref Range Status   Specimen Description BLOOD RIGHT ANTECUBITAL  Final   Special Requests BOTTLES DRAWN AEROBIC AND ANAEROBIC 5 CC EACH  Final   Culture  Setup Time   Final    09/12/2014  19:25 Performed at [facility]    Culture   Final    NO GROWTH 5 DAYS Performed at [facility]    Report Status 09/18/2014 FINAL  Final  Blood culture (routine x 2)     Status: None   Collection Time: 09/12/14  9:23 AM  Result Value Ref Range Status   Specimen Description BLOOD LEFT ANTECUBITAL  Final   Special Requests BOTTLES DRAWN AEROBIC AND ANAEROBIC 5 CC EACH  Final   Culture  Setup Time   Final    09/12/2014 19:25 Performed at [facility]    Culture   Final    NO GROWTH 5 DAYS Performed at [facility]    Report Status 09/18/2014  FINAL  Final  Urine culture     Status: None   Collection Time: 09/12/14 10:10 AM  Result Value Ref Range Status   Specimen Description URINE, CATHETERIZED  Final   Special Requests NONE  Final   Culture  Setup Time   Final    09/12/2014 15:54 Performed at [facility]    Colony Count NO GROWTH Performed at [facility]   Final   Culture NO GROWTH Performed at [facility]   Final   Report Status 09/13/2014 FINAL  Final  MRSA PCR Screening     Status: None   Collection Time: 09/12/14  3:47 PM  Result Value Ref Range Status   MRSA by PCR NEGATIVE NEGATIVE Final    Comment:        The GeneXpert MRSA Assay (FDA approved for NASAL specimens only), is one component of a comprehensive MRSA colonization surveillance program. It is not intended to diagnose MRSA infection nor to guide or monitor treatment for MRSA infections.   Culture, Urine     Status: None   Collection Time: 09/15/14 10:46 AM  Result Value Ref Range Status   Specimen Description URINE, CATHETERIZED  Final   Special Requests NONE  Final   Culture  Setup Time   Final    09/15/2014 14:44 Performed at [facility]    Colony Count NO GROWTH Performed at [facility]   Final   Culture NO GROWTH Performed at [facility]   Final   Report Status 09/16/2014 FINAL  Final  Clostridium  Difficile by PCR     Status: None   Collection Time: 09/18/14  2:15 PM  Result Value Ref Range Status   C difficile by pcr NEGATIVE NEGATIVE Final    Comment: Performed at [facility]     Labs: Basic Metabolic Panel:  Recent Labs Lab 09/14/14 0345  09/14/14 0348 09/15/14 0340 09/16/14 0443 09/17/14 0404 09/18/14 0410 09/19/14 0425 09/20/14 0356  NA  --   < > 145 144 143 142 141 141 139  K  --   < > 3.2* 3.5* 3.2* 4.2 4.3 4.3 4.9  CL  --   < > 105 105 105 106 104 100 99  CO2  --   < > 26 28 30 30  32 33* 33*  GLUCOSE  --   < > 164* 178* 117* 165* 148* 169* 153*  BUN  --   < > 20 16 12 12 10 10 13   CREATININE  --   < > 1.13 0.98 0.86 0.88 0.79 0.85 0.96  CALCIUM  --   < > 6.6* 7.1* 7.4* 8.3* 9.1 9.0 9.1  MG  --   --  1.3* 1.7 2.1  --   --   --   --   PHOS 3.1  --   --   --   --   --   --   --   --   < > = values in this interval not displayed. Liver Function Tests:  Recent Labs Lab 09/15/14 0340  AST 15  ALT 8  ALKPHOS 42  BILITOT 0.6  PROT 5.1*  ALBUMIN 2.1*   No results for input(s): LIPASE, AMYLASE in the last 168 hours. No results for input(s): AMMONIA in the last 168 hours. CBC:  Recent Labs Lab 09/15/14 1432 09/16/14 0443 09/17/14 0404 09/18/14 0410 09/19/14 0425  WBC 6.8 7.0 7.5 8.0 10.3  HGB 9.9* 8.9* 9.4* 10.2* 10.5*  HCT 33.6* 30.3* 32.3* 34.9* 35.5*  MCV 87.7 87.6 87.8 89.3 87.2  PLT 171 182 186 223 243   Cardiac Enzymes: No results for input(s): CKTOTAL, CKMB, CKMBINDEX, TROPONINI in the last 168 hours. BNP: BNP (last 3 results) No results for input(s): PROBNP in the last 8760 hours. CBG:  Recent Labs Lab 09/19/14 0801 09/19/14 1202 09/19/14 1644 09/19/14 2106 09/20/14 0749  GLUCAP 146* 175* 187* 289* 155*       Signed:  THOMPSON,DANIEL MD [facility] 09/20/2014, 11:38 AM

## 2014-09-20 NOTE — Progress Notes (Signed)
Report given to Marylene LandAngela RN at [facility].  Went over all scheduled and PRN medications given.  Answered all questions.  Pt VSS, IV taken out and telemetry taken off.  Packet beside pt. Room for PTAR to pick up.

## 2014-10-07 ENCOUNTER — Ambulatory Visit: Payer: Medicare Other

## 2014-10-10 ENCOUNTER — Inpatient Hospital Stay (HOSPITAL_COMMUNITY)
Admission: EM | Admit: 2014-10-10 | Discharge: 2014-10-14 | DRG: 291 | Disposition: A | Discharge status: Hospice | Payer: Medicare Other | Attending: Internal Medicine | Admitting: Internal Medicine

## 2014-10-10 ENCOUNTER — Encounter (HOSPITAL_COMMUNITY): Payer: Self-pay | Admitting: *Deleted

## 2014-10-10 ENCOUNTER — Emergency Department (HOSPITAL_COMMUNITY): Payer: Medicare Other

## 2014-10-10 DIAGNOSIS — R531 Weakness: Secondary | ICD-10-CM

## 2014-10-10 DIAGNOSIS — F419 Anxiety disorder, unspecified: Secondary | ICD-10-CM | POA: Diagnosis present

## 2014-10-10 DIAGNOSIS — R451 Restlessness and agitation: Secondary | ICD-10-CM | POA: Diagnosis not present

## 2014-10-10 DIAGNOSIS — Z9981 Dependence on supplemental oxygen: Secondary | ICD-10-CM

## 2014-10-10 DIAGNOSIS — Z794 Long term (current) use of insulin: Secondary | ICD-10-CM

## 2014-10-10 DIAGNOSIS — Z8249 Family history of ischemic heart disease and other diseases of the circulatory system: Secondary | ICD-10-CM

## 2014-10-10 DIAGNOSIS — E0865 Diabetes mellitus due to underlying condition with hyperglycemia: Secondary | ICD-10-CM | POA: Diagnosis present

## 2014-10-10 DIAGNOSIS — R0602 Shortness of breath: Secondary | ICD-10-CM

## 2014-10-10 DIAGNOSIS — I509 Heart failure, unspecified: Secondary | ICD-10-CM | POA: Diagnosis not present

## 2014-10-10 DIAGNOSIS — I712 Thoracic aortic aneurysm, without rupture: Secondary | ICD-10-CM | POA: Diagnosis present

## 2014-10-10 DIAGNOSIS — Z87891 Personal history of nicotine dependence: Secondary | ICD-10-CM

## 2014-10-10 DIAGNOSIS — J9602 Acute respiratory failure with hypercapnia: Secondary | ICD-10-CM | POA: Diagnosis present

## 2014-10-10 DIAGNOSIS — I4892 Unspecified atrial flutter: Secondary | ICD-10-CM | POA: Diagnosis present

## 2014-10-10 DIAGNOSIS — J441 Chronic obstructive pulmonary disease with (acute) exacerbation: Secondary | ICD-10-CM | POA: Diagnosis present

## 2014-10-10 DIAGNOSIS — E1165 Type 2 diabetes mellitus with hyperglycemia: Secondary | ICD-10-CM | POA: Diagnosis present

## 2014-10-10 DIAGNOSIS — I482 Chronic atrial fibrillation, unspecified: Secondary | ICD-10-CM | POA: Diagnosis present

## 2014-10-10 DIAGNOSIS — I340 Nonrheumatic mitral (valve) insufficiency: Secondary | ICD-10-CM | POA: Diagnosis present

## 2014-10-10 DIAGNOSIS — Z9119 Patient's noncompliance with other medical treatment and regimen: Secondary | ICD-10-CM | POA: Diagnosis present

## 2014-10-10 DIAGNOSIS — F0390 Unspecified dementia without behavioral disturbance: Secondary | ICD-10-CM | POA: Diagnosis present

## 2014-10-10 DIAGNOSIS — Z66 Do not resuscitate: Secondary | ICD-10-CM | POA: Diagnosis present

## 2014-10-10 DIAGNOSIS — Z515 Encounter for palliative care: Secondary | ICD-10-CM

## 2014-10-10 DIAGNOSIS — I5033 Acute on chronic diastolic (congestive) heart failure: Secondary | ICD-10-CM

## 2014-10-10 DIAGNOSIS — I10 Essential (primary) hypertension: Secondary | ICD-10-CM | POA: Diagnosis present

## 2014-10-10 DIAGNOSIS — Z79899 Other long term (current) drug therapy: Secondary | ICD-10-CM

## 2014-10-10 DIAGNOSIS — Z79891 Long term (current) use of opiate analgesic: Secondary | ICD-10-CM

## 2014-10-10 DIAGNOSIS — G9341 Metabolic encephalopathy: Secondary | ICD-10-CM | POA: Diagnosis present

## 2014-10-10 DIAGNOSIS — D649 Anemia, unspecified: Secondary | ICD-10-CM | POA: Diagnosis present

## 2014-10-10 DIAGNOSIS — Z8679 Personal history of other diseases of the circulatory system: Secondary | ICD-10-CM

## 2014-10-10 DIAGNOSIS — J9600 Acute respiratory failure, unspecified whether with hypoxia or hypercapnia: Secondary | ICD-10-CM | POA: Insufficient documentation

## 2014-10-10 MED ORDER — SODIUM CHLORIDE 0.9 % IV SOLN
INTRAVENOUS | Status: DC
  Administered 2014-10-10: 10 mL/h via INTRAVENOUS

## 2014-10-10 NOTE — ED Provider Notes (Signed)
CSN: [id number]     Arrival date & time 10/10/14  2300 History  This chart was scribed for Toy BakerAnthony T Katalyn Matin, MD by Annye AsaAnna Dorsett, ED Scribe. This patient was seen in room A09C/A09C and the patient's care was started at 11:41 PM.    Chief Complaint  Patient presents with  . Shortness of Breath   Patient is a 81 y.o. male presenting with shortness of breath. The history is provided by the patient, the spouse and a relative. No language interpreter was used.  Shortness of Breath Associated symptoms: cough      HPI Comments: Glen Miller is a 81 y.o. male with past medical history of COPD who presents to the Emergency Department complaining of gradually worsening SOB. Wife explains that patient is regularly on 2L O2 at home; patient's O2 sats gradually decreased today (lowest reported at 67%). They called PCP and were told to increase his to 3.5L O2; his sats reportedly improved to around 90% but no higher. Family reports cough. Family denies known fever, chest pain, vomiting, diarrhea, wheezing.  Patient explains that he "didn't notice" being SOB; he has "probably" been coughing more. Family believes that patient has been more lethargic than normal.   Patient was hospitalized in mid-December (09/12/14) for "seizures" and AMS.  Past Medical History  Diagnosis Date  . Hypertension   . Diabetes mellitus   . COPD (chronic obstructive pulmonary disease)   . Dementia   . Atrial fibrillation   . AAA (abdominal aortic aneurysm) without rupture 09/16/2014   Past Surgical History  Procedure Laterality Date  . Kidney stones    . Back surgery    . Exploration post operative open heart    . Triple bypass    . Cholecystectomy    . Knee arthroscopy    . Appendectomy    . Eye surgery    . Joint replacement     Family History  Problem Relation Age of Onset  . Dementia Mother   . Congestive Heart Failure Father    History  Substance Use Topics  . Smoking status: Former Games developermoker  . Smokeless  tobacco: Not on file  . Alcohol Use: No    Review of Systems  Respiratory: Positive for cough and shortness of breath.   All other systems reviewed and are negative.   Allergies  Review of patient's allergies indicates no known allergies.  Home Medications   Prior to Admission medications   Medication Sig Start Date End Date Taking? Authorizing Provider  acetaminophen (TYLENOL) 500 MG tablet Take 1,000 mg by mouth every 6 (six) hours as needed. For pain   Yes Historical Provider, MD  Calcium Carbonate-Vitamin D (CALCIUM 600/VITAMIN D) 600-400 MG-UNIT per tablet Take 1 tablet by mouth 2 (two) times daily.   Yes Historical Provider, MD  celecoxib (CELEBREX) 200 MG capsule Take 200 mg by mouth daily.   Yes Historical Provider, MD  cetirizine (ZYRTEC) 10 MG tablet Take 10 mg by mouth at bedtime.   Yes Historical Provider, MD  diltiazem (CARDIZEM CD) 360 MG 24 hr capsule Take 1 capsule (360 mg total) by mouth daily. 09/20/14  Yes Rodolph Bonganiel Thompson V, MD  FLUoxetine (PROZAC) 20 MG capsule Take 40 mg by mouth daily.   Yes Historical Provider, MD  furosemide (LASIX) 40 MG tablet Take 60 mg by mouth daily.    Yes Historical Provider, MD  gabapentin (NEURONTIN) 400 MG capsule Take 400 mg by mouth 2 (two) times daily.   Yes Historical Provider,  MD  hydrOXYzine (ATARAX/VISTARIL) 25 MG tablet Take 25 mg by mouth 2 (two) times daily as needed for itching (itching).  11/18/13  Yes Historical Provider, MD  insulin glargine (LANTUS) 100 UNIT/ML injection Inject 30 Units into the skin at bedtime.   Yes Historical Provider, MD  ipratropium-albuterol (DUONEB) 0.5-2.5 (3) MG/3ML SOLN Take 3 mLs by nebulization 3 (three) times daily as needed (sortness of breath).   Yes Historical Provider, MD  isosorbide mononitrate (IMDUR) 30 MG 24 hr tablet Take 1 tablet (30 mg total) by mouth daily. 09/20/14  Yes Rodolph Bong, MD  levETIRAcetam (KEPPRA) 500 MG tablet Take 1 tablet (500 mg total) by mouth 2 (two) times  daily. 09/20/14  Yes Rodolph Bong, MD  LORazepam (ATIVAN) 1 MG tablet Take 1 mg by mouth every 15 (fifteen) minutes as needed for seizure.   Yes Historical Provider, MD  magnesium oxide (MAG-OX) 400 MG tablet Take 400 mg by mouth 2 (two) times daily.   Yes Historical Provider, MD  memantine (NAMENDA) 10 MG tablet Take 10 mg by mouth 2 (two) times daily.   Yes Historical Provider, MD  metFORMIN (GLUCOPHAGE) 500 MG tablet Take 500 mg by mouth 2 (two) times daily with a meal.   Yes Historical Provider, MD  nitroGLYCERIN (NITROLINGUAL) 0.4 MG/SPRAY spray Place 1 spray under the tongue every 5 (five) minutes as needed. For chest pain   Yes Historical Provider, MD  omeprazole (PRILOSEC) 20 MG capsule Take 20 mg by mouth daily.   Yes Historical Provider, MD  oxyCODONE-acetaminophen (PERCOCET/ROXICET) 5-325 MG per tablet Take 1 tablet by mouth every 4 (four) hours as needed for severe pain. 09/20/14  Yes Rodolph Bong, MD  potassium chloride SA (K-DUR,KLOR-CON) 20 MEQ tablet Take 20 mEq by mouth 2 (two) times daily.   Yes Historical Provider, MD  promethazine (PHENERGAN) 25 MG tablet Take 25 mg by mouth every 6 (six) hours as needed for nausea.  09/11/14  Yes Historical Provider, MD  Tamsulosin HCl (FLOMAX) 0.4 MG CAPS Take 0.4 mg by mouth every evening.   Yes Historical Provider, MD  traMADol (ULTRAM) 50 MG tablet Take 1 tablet (50 mg total) by mouth every 6 (six) hours as needed. Patient taking differently: Take 50 mg by mouth every 6 (six) hours as needed for moderate pain.  09/20/14  Yes Rodolph Bong, MD  clobetasol cream (TEMOVATE) 0.05 % Apply 1 application topically 2 (two) times daily as needed (skin outbreak).  11/18/13   Historical Provider, MD  econazole nitrate 1 % cream Apply topically daily. Apply to affected area twice daily. Patient not taking: Reported on 10/10/2014 05/11/14   Max T Hyatt, DPM  ezetimibe-simvastatin (VYTORIN) 10-40 MG per tablet Take 1 tablet by mouth daily.     Historical Provider, MD  feeding supplement, GLUCERNA SHAKE, (GLUCERNA SHAKE) LIQD Take 237 mLs by mouth 2 (two) times daily between meals. Patient not taking: Reported on 10/10/2014 09/20/14   Rodolph Bong, MD  magnesium oxide (MAG-OX) 400 MG tablet Take 400 mg by mouth daily.    Historical Provider, MD  SPIRIVA HANDIHALER 18 MCG inhalation capsule Place 18 mcg into inhaler and inhale daily as needed (copd).  09/01/13   Historical Provider, MD  triamcinolone cream (KENALOG) 0.1 % Apply 1 application topically 2 (two) times daily as needed (skin outbreak).  12/03/13   Historical Provider, MD  ZONALON 5 % cream Apply 1 application topically daily as needed for itching (back).  12/01/13   Historical  Provider, MD   BP 112/59 mmHg  Pulse 83  Temp(Src) 98.1 F (36.7 C) (Oral)  Resp 19  Ht  (1.778 m)  Wt 220 lb (99.791 kg)  BMI 31.57 kg/m2  SpO2 96% Physical Exam  Constitutional: He is oriented to person, place, and time. He appears well-developed and well-nourished.  Non-toxic appearance. No distress.  HENT:  Head: Normocephalic and atraumatic.  Eyes: Conjunctivae, EOM and lids are normal. Pupils are equal, round, and reactive to light.  Neck: Normal range of motion. Neck supple. No tracheal deviation present. No thyroid mass present.  Cardiovascular: Normal rate, regular rhythm and normal heart sounds.  Exam reveals no gallop.   No murmur heard. 3+ pitting edema in BLe  Pulmonary/Chest: Effort normal. No stridor. No respiratory distress. He has no decreased breath sounds. He has wheezes. He has no rhonchi. He has no rales.  Epiratory wheezing bilaterally; no rhonchi  Abdominal: Soft. Normal appearance and bowel sounds are normal. He exhibits no distension. There is no tenderness. There is no rebound and no CVA tenderness.  Musculoskeletal: Normal range of motion. He exhibits no edema or tenderness.  Neurological: He is alert and oriented to person, place, and time. He has normal  strength. No cranial nerve deficit or sensory deficit. GCS eye subscore is 4. GCS verbal subscore is 5. GCS motor subscore is 6.  Skin: Skin is warm and dry. No abrasion and no rash noted.  Psychiatric: He has a normal mood and affect. His speech is normal and behavior is normal.  Nursing note and vitals reviewed.   ED Course  Procedures   DIAGNOSTIC STUDIES: Oxygen Saturation is 94% on [place] 5L/min, adequate by my interpretation.    COORDINATION OF CARE: 11:45 PM Discussed history with family at bedside; will return when patient returns from testing.   12:02 AM Discussed history with patient and family. Explained treatment plan with pt at bedside and pt agreed to plan.    Labs Review Labs Reviewed  CBC WITH DIFFERENTIAL  COMPREHENSIVE METABOLIC PANEL  TROPONIN I  BRAIN NATRIURETIC PEPTIDE    Imaging Review No results found.   EKG Interpretation   Date/Time:  Sunday October 10 2014 23:05:03 EST Ventricular Rate:  73 PR Interval:    QRS Duration: 113 QT Interval:  537 QTC Calculation: 592 R Axis:   44 Text Interpretation:  Atrial flutter with predominant 4:1 AV block  Borderline intraventricular conduction delay Nonspecific T abnrm,  anterolateral leads Prolonged QT interval new from prior Confirmed by  Avir Deruiter  MD, Serrena Linderman ([id number]) on 10/10/2014 11:54:49 PM      MDM   Final diagnoses:  SOB (shortness of breath)    I personally performed the services described in this documentation, which was scribed in my presence. The recorded information has been reviewed and is accurate.  Pt given lasix and will be admitted to [facility]     Toy Baker, MD 10/11/14 0040

## 2014-10-10 NOTE — ED Notes (Signed)
Pt arrives via EMS from [facility]. Staff called EMS after finding pts O2 sats at 84% on his chronic 3.5 L Lead. EMS placed pt on 5L and brought O2 to 94%. Pt denies any SOB different from his chronic SOB.

## 2014-10-11 ENCOUNTER — Inpatient Hospital Stay (HOSPITAL_COMMUNITY): Payer: Medicare Other

## 2014-10-11 DIAGNOSIS — F0390 Unspecified dementia without behavioral disturbance: Secondary | ICD-10-CM | POA: Diagnosis present

## 2014-10-11 DIAGNOSIS — I482 Chronic atrial fibrillation: Secondary | ICD-10-CM

## 2014-10-11 DIAGNOSIS — D649 Anemia, unspecified: Secondary | ICD-10-CM

## 2014-10-11 DIAGNOSIS — Z8249 Family history of ischemic heart disease and other diseases of the circulatory system: Secondary | ICD-10-CM | POA: Diagnosis not present

## 2014-10-11 DIAGNOSIS — Z8679 Personal history of other diseases of the circulatory system: Secondary | ICD-10-CM | POA: Diagnosis not present

## 2014-10-11 DIAGNOSIS — F419 Anxiety disorder, unspecified: Secondary | ICD-10-CM | POA: Diagnosis present

## 2014-10-11 DIAGNOSIS — Z87891 Personal history of nicotine dependence: Secondary | ICD-10-CM | POA: Diagnosis not present

## 2014-10-11 DIAGNOSIS — I10 Essential (primary) hypertension: Secondary | ICD-10-CM | POA: Diagnosis present

## 2014-10-11 DIAGNOSIS — J441 Chronic obstructive pulmonary disease with (acute) exacerbation: Secondary | ICD-10-CM | POA: Diagnosis present

## 2014-10-11 DIAGNOSIS — Z9119 Patient's noncompliance with other medical treatment and regimen: Secondary | ICD-10-CM | POA: Diagnosis present

## 2014-10-11 DIAGNOSIS — J9602 Acute respiratory failure with hypercapnia: Secondary | ICD-10-CM | POA: Diagnosis present

## 2014-10-11 DIAGNOSIS — I340 Nonrheumatic mitral (valve) insufficiency: Secondary | ICD-10-CM | POA: Diagnosis present

## 2014-10-11 DIAGNOSIS — Z515 Encounter for palliative care: Secondary | ICD-10-CM | POA: Diagnosis not present

## 2014-10-11 DIAGNOSIS — I509 Heart failure, unspecified: Secondary | ICD-10-CM | POA: Diagnosis not present

## 2014-10-11 DIAGNOSIS — Z79891 Long term (current) use of opiate analgesic: Secondary | ICD-10-CM | POA: Diagnosis not present

## 2014-10-11 DIAGNOSIS — G9341 Metabolic encephalopathy: Secondary | ICD-10-CM | POA: Diagnosis present

## 2014-10-11 DIAGNOSIS — Z66 Do not resuscitate: Secondary | ICD-10-CM | POA: Diagnosis present

## 2014-10-11 DIAGNOSIS — E1165 Type 2 diabetes mellitus with hyperglycemia: Secondary | ICD-10-CM | POA: Diagnosis present

## 2014-10-11 DIAGNOSIS — Z794 Long term (current) use of insulin: Secondary | ICD-10-CM | POA: Diagnosis not present

## 2014-10-11 DIAGNOSIS — R0602 Shortness of breath: Secondary | ICD-10-CM | POA: Diagnosis present

## 2014-10-11 DIAGNOSIS — I712 Thoracic aortic aneurysm, without rupture: Secondary | ICD-10-CM | POA: Diagnosis present

## 2014-10-11 DIAGNOSIS — Z79899 Other long term (current) drug therapy: Secondary | ICD-10-CM | POA: Diagnosis not present

## 2014-10-11 DIAGNOSIS — E0865 Diabetes mellitus due to underlying condition with hyperglycemia: Secondary | ICD-10-CM

## 2014-10-11 DIAGNOSIS — I4892 Unspecified atrial flutter: Secondary | ICD-10-CM | POA: Diagnosis present

## 2014-10-11 DIAGNOSIS — Z9981 Dependence on supplemental oxygen: Secondary | ICD-10-CM | POA: Diagnosis not present

## 2014-10-11 LAB — COMPREHENSIVE METABOLIC PANEL
ALBUMIN: 2.6 g/dL — AB (ref 3.5–5.2)
ALK PHOS: 62 U/L (ref 39–117)
ALT: 8 U/L (ref 0–53)
AST: 15 U/L (ref 0–37)
Anion gap: 15 (ref 5–15)
BUN: 22 mg/dL (ref 6–23)
CALCIUM: 8.7 mg/dL (ref 8.4–10.5)
CHLORIDE: 97 meq/L (ref 96–112)
CO2: 30 mmol/L (ref 19–32)
Creatinine, Ser: 1.35 mg/dL (ref 0.50–1.35)
GFR, EST AFRICAN AMERICAN: 54 mL/min — AB (ref >90)
GFR, EST NON AFRICAN AMERICAN: 47 mL/min — AB (ref >90)
Glucose, Bld: 172 mg/dL — ABNORMAL HIGH (ref 70–99)
Potassium: 4.8 mmol/L (ref 3.5–5.1)
SODIUM: 142 mmol/L (ref 135–145)
Total Bilirubin: 0.6 mg/dL (ref 0.3–1.2)
Total Protein: 5.2 g/dL — ABNORMAL LOW (ref 6.0–8.3)

## 2014-10-11 LAB — CBC WITH DIFFERENTIAL/PLATELET
Basophils Absolute: 0 10*3/uL (ref 0.0–0.1)
Basophils Relative: 0 % (ref 0–1)
Eosinophils Absolute: 0.1 10*3/uL (ref 0.0–0.7)
Eosinophils Relative: 1 % (ref 0–5)
HCT: 28.4 % — ABNORMAL LOW (ref 39.0–52.0)
Hemoglobin: 8.3 g/dL — ABNORMAL LOW (ref 13.0–17.0)
Lymphocytes Relative: 11 % — ABNORMAL LOW (ref 12–46)
Lymphs Abs: 0.8 10*3/uL (ref 0.7–4.0)
MCH: 24.4 pg — AB (ref 26.0–34.0)
MCHC: 29.2 g/dL — ABNORMAL LOW (ref 30.0–36.0)
MCV: 83.5 fL (ref 78.0–100.0)
MONOS PCT: 8 % (ref 3–12)
Monocytes Absolute: 0.6 10*3/uL (ref 0.1–1.0)
NEUTROS PCT: 80 % — AB (ref 43–77)
Neutro Abs: 5.6 10*3/uL (ref 1.7–7.7)
Platelets: 190 10*3/uL (ref 150–400)
RBC: 3.4 MIL/uL — ABNORMAL LOW (ref 4.22–5.81)
RDW: 17.8 % — ABNORMAL HIGH (ref 11.5–15.5)
WBC: 7.1 10*3/uL (ref 4.0–10.5)

## 2014-10-11 LAB — GLUCOSE, CAPILLARY
GLUCOSE-CAPILLARY: 189 mg/dL — AB (ref 70–99)
Glucose-Capillary: 133 mg/dL — ABNORMAL HIGH (ref 70–99)

## 2014-10-11 LAB — BLOOD GAS, ARTERIAL
Acid-Base Excess: 12.4 mmol/L — ABNORMAL HIGH (ref 0.0–2.0)
Bicarbonate: 37.4 mEq/L — ABNORMAL HIGH (ref 20.0–24.0)
DRAWN BY: 41977
O2 Content: 5 L/min
O2 SAT: 90.6 %
PCO2 ART: 57.2 mmHg — AB (ref 35.0–45.0)
PO2 ART: 65.2 mmHg — AB (ref 80.0–100.0)
Patient temperature: 98.6
TCO2: 39.1 mmol/L (ref 0–100)
pH, Arterial: 7.43 (ref 7.350–7.450)

## 2014-10-11 LAB — URINALYSIS, ROUTINE W REFLEX MICROSCOPIC
Bilirubin Urine: NEGATIVE
Glucose, UA: NEGATIVE mg/dL
Hgb urine dipstick: NEGATIVE
KETONES UR: NEGATIVE mg/dL
LEUKOCYTES UA: NEGATIVE
Nitrite: NEGATIVE
PH: 5 (ref 5.0–8.0)
Protein, ur: NEGATIVE mg/dL
Specific Gravity, Urine: 1.01 (ref 1.005–1.030)
Urobilinogen, UA: 0.2 mg/dL (ref 0.0–1.0)

## 2014-10-11 LAB — BRAIN NATRIURETIC PEPTIDE: B Natriuretic Peptide: 519 pg/mL — ABNORMAL HIGH (ref 0.0–100.0)

## 2014-10-11 LAB — HEMOGLOBIN A1C
Hgb A1c MFr Bld: 7 % — ABNORMAL HIGH (ref <5.7)
Mean Plasma Glucose: 154 mg/dL — ABNORMAL HIGH (ref <117)

## 2014-10-11 LAB — CBG MONITORING, ED: Glucose-Capillary: 155 mg/dL — ABNORMAL HIGH (ref 70–99)

## 2014-10-11 LAB — TROPONIN I: Troponin I: <0.03 ng/mL (ref <0.031)

## 2014-10-11 LAB — MRSA PCR SCREENING: MRSA BY PCR: NEGATIVE

## 2014-10-11 MED ORDER — METFORMIN HCL 500 MG PO TABS
500.0000 mg | ORAL_TABLET | Freq: Two times a day (BID) | ORAL | Status: DC
  Administered 2014-10-11: 500 mg via ORAL
  Filled 2014-10-11: qty 1

## 2014-10-11 MED ORDER — HYDROXYZINE HCL 25 MG PO TABS
25.0000 mg | ORAL_TABLET | Freq: Two times a day (BID) | ORAL | Status: DC | PRN
  Filled 2014-10-11: qty 1

## 2014-10-11 MED ORDER — TRAMADOL HCL 50 MG PO TABS
50.0000 mg | ORAL_TABLET | Freq: Four times a day (QID) | ORAL | Status: DC | PRN
Start: 2014-10-11 — End: 2014-10-12

## 2014-10-11 MED ORDER — CELECOXIB 200 MG PO CAPS
200.0000 mg | ORAL_CAPSULE | Freq: Every day | ORAL | Status: DC
  Administered 2014-10-11 – 2014-10-12 (×2): 200 mg via ORAL
  Filled 2014-10-11 (×5): qty 1

## 2014-10-11 MED ORDER — ONDANSETRON HCL 4 MG/2ML IJ SOLN
4.0000 mg | Freq: Four times a day (QID) | INTRAMUSCULAR | Status: DC | PRN
  Administered 2014-10-14: 4 mg via INTRAVENOUS
  Filled 2014-10-11: qty 2

## 2014-10-11 MED ORDER — POTASSIUM CHLORIDE CRYS ER 20 MEQ PO TBCR
20.0000 meq | EXTENDED_RELEASE_TABLET | Freq: Two times a day (BID) | ORAL | Status: DC
  Administered 2014-10-11 – 2014-10-12 (×4): 20 meq via ORAL
  Filled 2014-10-11 (×8): qty 1

## 2014-10-11 MED ORDER — IPRATROPIUM-ALBUTEROL 0.5-2.5 (3) MG/3ML IN SOLN: 3.0000 mL | Freq: Three times a day (TID) | RESPIRATORY_TRACT | Status: DC | PRN

## 2014-10-11 MED ORDER — PANTOPRAZOLE SODIUM 40 MG PO TBEC
40.0000 mg | DELAYED_RELEASE_TABLET | Freq: Every day | ORAL | Status: DC
  Administered 2014-10-11 – 2014-10-12 (×2): 40 mg via ORAL
  Filled 2014-10-11 (×2): qty 1

## 2014-10-11 MED ORDER — MAGNESIUM OXIDE 400 (241.3 MG) MG PO TABS
400.0000 mg | ORAL_TABLET | Freq: Two times a day (BID) | ORAL | Status: DC
  Administered 2014-10-11 – 2014-10-12 (×4): 400 mg via ORAL
  Filled 2014-10-11 (×10): qty 1

## 2014-10-11 MED ORDER — ISOSORBIDE MONONITRATE ER 30 MG PO TB24
30.0000 mg | ORAL_TABLET | Freq: Every day | ORAL | Status: DC
  Administered 2014-10-11: 30 mg via ORAL
  Filled 2014-10-11 (×4): qty 1

## 2014-10-11 MED ORDER — SODIUM CHLORIDE 0.9 % IJ SOLN: 3.0000 mL | INTRAMUSCULAR | Status: DC | PRN

## 2014-10-11 MED ORDER — LISINOPRIL 2.5 MG PO TABS
2.5000 mg | ORAL_TABLET | Freq: Every day | ORAL | Status: DC
  Administered 2014-10-11: 2.5 mg via ORAL
  Filled 2014-10-11 (×3): qty 1

## 2014-10-11 MED ORDER — HEPARIN SODIUM (PORCINE) 5000 UNIT/ML IJ SOLN: 5000.0000 [IU] | Freq: Three times a day (TID) | INTRAMUSCULAR | Status: DC

## 2014-10-11 MED ORDER — TAMSULOSIN HCL 0.4 MG PO CAPS
0.4000 mg | ORAL_CAPSULE | Freq: Every evening | ORAL | Status: DC
  Administered 2014-10-11: 0.4 mg via ORAL
  Filled 2014-10-11 (×2): qty 1

## 2014-10-11 MED ORDER — FUROSEMIDE 10 MG/ML IJ SOLN
80.0000 mg | Freq: Three times a day (TID) | INTRAMUSCULAR | Status: DC
  Administered 2014-10-11 – 2014-10-13 (×5): 80 mg via INTRAVENOUS
  Filled 2014-10-11 (×10): qty 8

## 2014-10-11 MED ORDER — FUROSEMIDE 10 MG/ML IJ SOLN
80.0000 mg | Freq: Two times a day (BID) | INTRAMUSCULAR | Status: DC
  Administered 2014-10-11 (×2): 80 mg via INTRAVENOUS
  Filled 2014-10-11 (×2): qty 8

## 2014-10-11 MED ORDER — GABAPENTIN 400 MG PO CAPS
400.0000 mg | ORAL_CAPSULE | Freq: Two times a day (BID) | ORAL | Status: DC
  Administered 2014-10-11: 400 mg via ORAL
  Filled 2014-10-11 (×4): qty 1

## 2014-10-11 MED ORDER — LORATADINE 10 MG PO TABS
10.0000 mg | ORAL_TABLET | Freq: Every day | ORAL | Status: DC
  Administered 2014-10-11: 10 mg via ORAL
  Filled 2014-10-11 (×3): qty 1

## 2014-10-11 MED ORDER — FLUOXETINE HCL 20 MG PO CAPS
40.0000 mg | ORAL_CAPSULE | Freq: Every day | ORAL | Status: DC
  Administered 2014-10-11: 40 mg via ORAL
  Filled 2014-10-11 (×3): qty 2

## 2014-10-11 MED ORDER — FUROSEMIDE 10 MG/ML IJ SOLN
INTRAMUSCULAR | Status: AC
  Filled 2014-10-11: qty 8

## 2014-10-11 MED ORDER — PROMETHAZINE HCL 25 MG PO TABS
25.0000 mg | ORAL_TABLET | Freq: Four times a day (QID) | ORAL | Status: DC | PRN
  Filled 2014-10-11: qty 1

## 2014-10-11 MED ORDER — SODIUM CHLORIDE 0.9 % IV SOLN: 250.0000 mL | INTRAVENOUS | Status: DC | PRN

## 2014-10-11 MED ORDER — NITROGLYCERIN 0.4 MG/SPRAY TL SOLN: 1.0000 | Status: DC | PRN

## 2014-10-11 MED ORDER — LEVETIRACETAM 500 MG PO TABS
500.0000 mg | ORAL_TABLET | Freq: Two times a day (BID) | ORAL | Status: DC
  Administered 2014-10-11 – 2014-10-12 (×4): 500 mg via ORAL
  Filled 2014-10-11 (×9): qty 1

## 2014-10-11 MED ORDER — MEMANTINE HCL 10 MG PO TABS
10.0000 mg | ORAL_TABLET | Freq: Two times a day (BID) | ORAL | Status: DC
  Administered 2014-10-11 (×2): 10 mg via ORAL
  Filled 2014-10-11 (×6): qty 1

## 2014-10-11 MED ORDER — SODIUM CHLORIDE 0.9 % IJ SOLN
3.0000 mL | Freq: Two times a day (BID) | INTRAMUSCULAR | Status: DC
  Administered 2014-10-11 – 2014-10-12 (×2): 3 mL via INTRAVENOUS

## 2014-10-11 MED ORDER — INSULIN GLARGINE 100 UNIT/ML ~~LOC~~ SOLN
30.0000 [IU] | Freq: Every day | SUBCUTANEOUS | Status: DC
  Administered 2014-10-11: 30 [IU] via SUBCUTANEOUS
  Administered 2014-10-12: 15 [IU] via SUBCUTANEOUS
  Filled 2014-10-11 (×4): qty 0.3

## 2014-10-11 MED ORDER — INSULIN ASPART 100 UNIT/ML ~~LOC~~ SOLN
0.0000 [IU] | Freq: Three times a day (TID) | SUBCUTANEOUS | Status: DC
  Administered 2014-10-12: 1 [IU] via SUBCUTANEOUS
  Administered 2014-10-13: 2 [IU] via SUBCUTANEOUS

## 2014-10-11 MED ORDER — ACETAMINOPHEN 500 MG PO TABS: 1000.0000 mg | ORAL_TABLET | Freq: Four times a day (QID) | ORAL | Status: DC | PRN

## 2014-10-11 MED ORDER — NITROGLYCERIN 0.4 MG SL SUBL
0.4000 mg | SUBLINGUAL_TABLET | SUBLINGUAL | Status: DC | PRN
Start: 2014-10-11 — End: 2014-10-14

## 2014-10-11 MED ORDER — OXYCODONE-ACETAMINOPHEN 5-325 MG PO TABS
1.0000 | ORAL_TABLET | ORAL | Status: DC | PRN
Start: 2014-10-11 — End: 2014-10-12

## 2014-10-11 MED ORDER — DILTIAZEM HCL ER COATED BEADS 360 MG PO CP24
360.0000 mg | ORAL_CAPSULE | Freq: Every day | ORAL | Status: DC
  Administered 2014-10-11 – 2014-10-12 (×2): 360 mg via ORAL
  Filled 2014-10-11 (×5): qty 1

## 2014-10-11 MED ORDER — FUROSEMIDE 10 MG/ML IJ SOLN
60.0000 mg | Freq: Once | INTRAMUSCULAR | Status: AC
  Administered 2014-10-11: 60 mg via INTRAVENOUS
  Filled 2014-10-11: qty 6

## 2014-10-11 NOTE — ED Notes (Signed)
Admitting MD at bedside.

## 2014-10-11 NOTE — ED Notes (Signed)
Patient transitioned back to nasal cannula @ 4L after O2 saturation recovered to 100%. Oxygen level returned to baseline 90-94%.

## 2014-10-11 NOTE — ED Notes (Signed)
CBG 155 

## 2014-10-11 NOTE — ED Notes (Signed)
Staffing office notified of need for sitter. 

## 2014-10-11 NOTE — Progress Notes (Signed)
Clinical Social Work Department BRIEF PSYCHOSOCIAL ASSESSMENT 10/11/2014  Patient:  Glen Miller,Glen Miller     Account Number:  [id number]     Admit date:  10/10/2014  Clinical Social Worker:  Andres ShadNAIL,Charlotte Brafford, LCSW  Date/Time:  10/11/2014 09:20 AM  Referred by:  Physician  Date Referred:  10/11/2014 Referred for  SNF Placement   Other Referral:   patient is from a SNF facility   Interview type:  Family Other interview type:   Patient also part of conversation, however has hx of dementia and not a good historian    PSYCHOSOCIAL DATA Living Status:  FACILITY Admitted from facility[facility]  [facility] Level of care:  Skilled Nursing Facility Primary support name:  Lupita LeashDonna  Daughter Primary support relationship to patient:  FAMILY Degree of support available:   Daughter at the bedside, very supportive. Will take patient home after he returns to SNF and recieves physical therapy.    CURRENT CONCERNS Current Concerns  Post-Acute Placement   Other Concerns:   Returning to SNF.    SOCIAL WORK ASSESSMENT / PLAN LCSW made aware patient from a facility. [facility]. Family wants patient to return for additional skilled rehab. patient has history of dementia per daughter who is at the bedside. Prior to facility, patient was at home with daughter, and plans on returning to home, once he is strong enough to be DC from SNF.  Plan at DC is for patient to return to SNF and daughter will do a bed hold.  SNF was contacted, spoke with Wille CelesteJanie who is aware of patient being admitted to hospital.   Assessment/plan status:  Psychosocial Support/Ongoing Assessment of Needs Other assessment/ plan:   Will hand off to unit CSW once patient is moved to the floor.   Information/referral to community resources:   None reported by family at this time.    FL2 will be updated and completed once patient medically stable for DC.    PATIENT'S/FAMILY'S RESPONSE TO  PLAN OF CARE: Daughter very emotional and appreciative of intervention. Patient is disoriented, and alert to self and place only. Daughter made aware of plans and will be working with unit CSW once patient moved.    Daughter phone number: [phone]     Deretha EmoryHannah Djeneba Barsch LCSW, MSW Clinical Social Work: Emergency Room [phone]

## 2014-10-11 NOTE — ED Notes (Signed)
Patient removed oxygen and refusing to allow replacement of nasal cannula. Redirected patient and nasal cannula now back in place, oxygen level recovered. MD Gardener paged.

## 2014-10-11 NOTE — ED Notes (Signed)
Patient continues to refuse oxygen therapy while arguing with MD. Appears increasingly lethargic, tachypneic, and O2 sat remains at 56%.

## 2014-10-11 NOTE — Progress Notes (Signed)
Pt's blood gas values as ff pH-7.430, pCO2- 57.2 mmHg, pO2- 65.2. K. Craige CottaKirby was notified

## 2014-10-11 NOTE — ED Notes (Signed)
Spoke with Beacher MayGardener, MD about difficulty maintaining oxygen therapy. Sitter advised. MD hesitant to provide anxiolytic or sedative at this time do to patient condition.

## 2014-10-11 NOTE — ED Notes (Signed)
Patient currently yelling loudly stating "help" and "they are killing me".

## 2014-10-11 NOTE — Progress Notes (Signed)
Dr. Susie CassetteAbrol called and I informed her of pt c/o not being able to breathe, on 6L O2 [place].  Physician is aware.

## 2014-10-11 NOTE — Progress Notes (Signed)
Text/paged Dr. Susie CassetteAbrol, r/t pt c/o unable to breathe.  Was on 3.5L O2, and bumped O2 up to 6L.  Am waiting for response.

## 2014-10-11 NOTE — ED Notes (Signed)
This RN responded to bedside upon hearing free flowing oxygen in patient's room. Found patient in bed without nasal cannula on, pulse oximeter sensor removed, and requesting help to get up so he "can straighten things out". Upon speaking with patient he reports no feeling of SOB in spite of O2 saturation reading of 60%. Currently tachypneic and tachycardic compared to baseline. Oxygen via non-rebreather applied.

## 2014-10-11 NOTE — H&P (Signed)
[facility] History and Physical  Glen Miller [id number] DOB: Feb 28, 1928 DOA: 10/10/2014  Referring physician: EDP PCP: Georgann HousekeeperHUSAIN,KARRAR, MD   Chief Complaint: SOB   HPI: Glen BarrenSamuel V Chisenhall is a 81 y.o. male who presents to the ED with c/o SOB.  Symptoms are associated with cough but patient is a very poor historian and dosent notice much.  Family denies known CP, fever, N/V/D.  He has had cough which sounds productive.  Patient is regularly on 2L home O2 AT baseline for COPD, at home patients O2 sats gradually decreased today.  They called his PCP who told them to increase his O2 at home.  Sats reportedly improved to 90% but no higher so family brought him in to the ED for being more lethargic than baseline.  Review of Systems: Systems reviewed.  As above, otherwise negative  Past Medical History  Diagnosis Date  . Hypertension   . Diabetes mellitus   . COPD (chronic obstructive pulmonary disease)   . Dementia   . Atrial fibrillation   . AAA (abdominal aortic aneurysm) without rupture 09/16/2014   Past Surgical History  Procedure Laterality Date  . Kidney stones    . Back surgery    . Exploration post operative open heart    . Triple bypass    . Cholecystectomy    . Knee arthroscopy    . Appendectomy    . Eye surgery    . Joint replacement     Social History:  reports that he has quit smoking. He does not have any smokeless tobacco history on file. He reports that he does not drink alcohol or use illicit drugs.  No Known Allergies  Family History  Problem Relation Age of Onset  . Dementia Mother   . Congestive Heart Failure Father      Prior to Admission medications   Medication Sig Start Date End Date Taking? Authorizing Provider  acetaminophen (TYLENOL) 500 MG tablet Take 1,000 mg by mouth every 6 (six) hours as needed. For pain   Yes Historical Provider, MD  Calcium Carbonate-Vitamin D (CALCIUM 600/VITAMIN D) 600-400 MG-UNIT per tablet Take 1 tablet by mouth  2 (two) times daily.   Yes Historical Provider, MD  celecoxib (CELEBREX) 200 MG capsule Take 200 mg by mouth daily.   Yes Historical Provider, MD  cetirizine (ZYRTEC) 10 MG tablet Take 10 mg by mouth at bedtime.   Yes Historical Provider, MD  diltiazem (CARDIZEM CD) 360 MG 24 hr capsule Take 1 capsule (360 mg total) by mouth daily. 09/20/14  Yes Rodolph Bonganiel Thompson V, MD  FLUoxetine (PROZAC) 20 MG capsule Take 40 mg by mouth daily.   Yes Historical Provider, MD  furosemide (LASIX) 40 MG tablet Take 60 mg by mouth daily.    Yes Historical Provider, MD  gabapentin (NEURONTIN) 400 MG capsule Take 400 mg by mouth 2 (two) times daily.   Yes Historical Provider, MD  hydrOXYzine (ATARAX/VISTARIL) 25 MG tablet Take 25 mg by mouth 2 (two) times daily as needed for itching (itching).  11/18/13  Yes Historical Provider, MD  insulin glargine (LANTUS) 100 UNIT/ML injection Inject 30 Units into the skin at bedtime.   Yes Historical Provider, MD  ipratropium-albuterol (DUONEB) 0.5-2.5 (3) MG/3ML SOLN Take 3 mLs by nebulization 3 (three) times daily as needed (sortness of breath).   Yes Historical Provider, MD  isosorbide mononitrate (IMDUR) 30 MG 24 hr tablet Take 1 tablet (30 mg total) by mouth daily. 09/20/14  Yes Rodolph Bonganiel Thompson V, MD  levETIRAcetam (KEPPRA) 500 MG tablet Take 1 tablet (500 mg total) by mouth 2 (two) times daily. 09/20/14  Yes Rodolph Bong, MD  LORazepam (ATIVAN) 1 MG tablet Take 1 mg by mouth every 15 (fifteen) minutes as needed for seizure.   Yes Historical Provider, MD  magnesium oxide (MAG-OX) 400 MG tablet Take 400 mg by mouth 2 (two) times daily.   Yes Historical Provider, MD  memantine (NAMENDA) 10 MG tablet Take 10 mg by mouth 2 (two) times daily.   Yes Historical Provider, MD  metFORMIN (GLUCOPHAGE) 500 MG tablet Take 500 mg by mouth 2 (two) times daily with a meal.   Yes Historical Provider, MD  nitroGLYCERIN (NITROLINGUAL) 0.4 MG/SPRAY spray Place 1 spray under the tongue every 5  (five) minutes as needed. For chest pain   Yes Historical Provider, MD  omeprazole (PRILOSEC) 20 MG capsule Take 20 mg by mouth daily.   Yes Historical Provider, MD  oxyCODONE-acetaminophen (PERCOCET/ROXICET) 5-325 MG per tablet Take 1 tablet by mouth every 4 (four) hours as needed for severe pain. 09/20/14  Yes Rodolph Bong, MD  potassium chloride SA (K-DUR,KLOR-CON) 20 MEQ tablet Take 20 mEq by mouth 2 (two) times daily.   Yes Historical Provider, MD  promethazine (PHENERGAN) 25 MG tablet Take 25 mg by mouth every 6 (six) hours as needed for nausea.  09/11/14  Yes Historical Provider, MD  Tamsulosin HCl (FLOMAX) 0.4 MG CAPS Take 0.4 mg by mouth every evening.   Yes Historical Provider, MD  traMADol (ULTRAM) 50 MG tablet Take 1 tablet (50 mg total) by mouth every 6 (six) hours as needed. Patient taking differently: Take 50 mg by mouth every 6 (six) hours as needed for moderate pain.  09/20/14  Yes Rodolph Bong, MD   Physical Exam: Filed Vitals:   10/11/14 0045  BP: 115/59  Pulse: 74  Temp:   Resp: 21    BP 115/59 mmHg  Pulse 74  Temp(Src) 98.1 F (36.7 C) (Oral)  Resp 21  Ht  (1.778 m)  Wt 99.791 kg (220 lb)  BMI 31.57 kg/m2  SpO2 96%  General Appearance:    Alert, oriented, no distress, appears stated age  Head:    Normocephalic, atraumatic  Eyes:    PERRL, EOMI, sclera non-icteric        Nose:   Nares without drainage or epistaxis. Mucosa, turbinates normal  Throat:   Moist mucous membranes. Oropharynx without erythema or exudate.  Neck:   Supple. No carotid bruits.  No thyromegaly.  No lymphadenopathy.   Back:     No CVA tenderness, no spinal tenderness  Lungs:     Some rhonchi on exam.  Chest wall:    No tenderness to palpitation  Heart:    Regular rate and rhythm without murmurs, gallops, rubs  Abdomen:     Soft, non-tender, nondistended, normal bowel sounds, no organomegaly  Genitalia:    deferred  Rectal:    deferred  Extremities:   4+ pitting edema,  patient has pitting edema even including his face.  Pulses:   2+ and symmetric all extremities  Skin:   Skin color, texture, turgor normal, no rashes or lesions, pale skin  Lymph nodes:   Cervical, supraclavicular, and axillary nodes normal  Neurologic:   CNII-XII intact. Normal strength, sensation and reflexes      throughout    Labs on Admission:  Basic Metabolic Panel:  Recent Labs Lab 10/10/14 2327  NA 142  K 4.8  CL 97  CO2 30  GLUCOSE 172*  BUN 22  CREATININE 1.35  CALCIUM 8.7   Liver Function Tests:  Recent Labs Lab 10/10/14 2327  AST 15  ALT 8  ALKPHOS 62  BILITOT 0.6  PROT 5.2*  ALBUMIN 2.6*   No results for input(s): LIPASE, AMYLASE in the last 168 hours. No results for input(s): AMMONIA in the last 168 hours. CBC:  Recent Labs Lab 10/10/14 2327  WBC 7.1  NEUTROABS 5.6  HGB 8.3*  HCT 28.4*  MCV 83.5  PLT 190   Cardiac Enzymes:  Recent Labs Lab 10/10/14 2327  TROPONINI <0.03    BNP (last 3 results) No results for input(s): PROBNP in the last 8760 hours. CBG: No results for input(s): GLUCAP in the last 168 hours.  Radiological Exams on Admission: Dg Chest 2 View  10/11/2014   CLINICAL DATA:  Shortness of breath.  History of COPD.  EXAM: CHEST  2 VIEW  COMPARISON:  09/12/2014  FINDINGS: Patient is post median sternotomy. Cardiomegaly and tortuous thoracic aorta is again seen. There is mild vascular congestion and question upper lobe pulmonary edema. Bilateral pleural effusions, left greater than right, with associated bibasilar airspace disease.  IMPRESSION: 1. Vascular congestion and question upper lobe pulmonary edema. In combination with bilateral pleural effusions, question CHF. 2. Cardiomegaly and tortuous thoracic aorta, not significantly changed from prior.   Electronically Signed   By: Rubye Oaks M.D.   On: 10/11/2014 00:21    EKG: Independently reviewed.  Assessment/Plan Principal Problem:   Acute CHF (congestive heart  failure) Active Problems:   Chronic atrial fibrillation   Anemia   Diabetes mellitus due to underlying condition with hyperglycemia   1. Acute CHF - despite the lack of known CHF in the past, the patient is quite obviously in CHF at this time given imaging findings as well as physical exam findings.  In particular the wife does note that the pitting edema on the patients face where his nasal canula was lying is very new for him. 1. Heart failure pathway. 2. Lasix  IV now,  IV BID 3. Strict intake and output 4. 2D echo 5. Tele monitor 2. A.Flutter - (has long history of chronic a.fib at baseline) 1. Currently 4:1 2. Continue rate control with PO Cardizem. 3. Anemia -  1. Repeat CBC 2. Hemoccult ordered 3. Was due to gross hematuria back in Dec. 4. UA pending from today.   Code Status: Full Code  Family Communication: Wife at bedside Disposition Plan: Admit to inpatient   Time spent: 70 min  GARDNER, JARED M. [facility] Pager [phone]  If 7AM-7PM, please contact the day team taking care of the patient [email or web address] Password [email or web address] 10/11/2014, 1:08 AM

## 2014-10-11 NOTE — Progress Notes (Addendum)
Patient seen and examined Subjective Patient is confused but in no acute distress complains of nausea Currently on 4 L of oxygen Hemodynamically stable   Assessment and plan 1. Acute CHF - symptoms improved after receiving Lasix in the ED. In particular the wife does note that the pitting edema on the patients face where his nasal canula was lying is very new for him. 1. Heart failure pathway. 2. Continue Lasix 80mg  IV BID 3. Strict intake and output 4. 2D echo pending 5. Tele monitor, if worsening o2 requirement , transfer to SDU and place on bipap 2. A.Flutter - (has long history of chronic a.fib at baseline) 1. Currently 4:1 2. Continue rate control with PO Cardizem. 3. Anemia -  1. Repeat CBC 2. Hemoccult ordered 3. Was due to gross hematuria back in Dec. 4. UA pending from today.  History of generalized seizure Evaluated during last admission, continue Keppra   Reason history of aspiration pneumonia Patient has completed his course of antibiotic no indication for repeat antibiotics at this time  History of AAA Recent CT of the abdomen and pelvis with descending thoracic aortic aneurysm of 6.0 x 5.8 cm, no definite evidence of intramural hematoma   Diabetes hOld metformin Start the patient on sliding scale insulin Continue NovoLog SSI and Lantus  Pager [phone]

## 2014-10-11 NOTE — ED Notes (Signed)
MD Gardener arrived at bedside. Patient not redirectable, will not allow placement of any type of oxygen therapy device. Currently arguing with MD.

## 2014-10-11 NOTE — ED Notes (Signed)
Returned to patient bedside to find patient without nasal cannula and pulse oximeter sensor once again. Replaced sensor and patient's O2 saturation found to be 55% on RA. Attempted to place patient on non re-breather once again and patient attempted to hit this RN with right hand, but failed to make contact.

## 2014-10-11 NOTE — ED Notes (Signed)
While patient distracted by MD, this RN managed to reapply oxygen via nasal cannula @ 4L/hr.

## 2014-10-12 ENCOUNTER — Inpatient Hospital Stay (HOSPITAL_COMMUNITY): Payer: Medicare Other

## 2014-10-12 DIAGNOSIS — J9620 Acute and chronic respiratory failure, unspecified whether with hypoxia or hypercapnia: Secondary | ICD-10-CM

## 2014-10-12 DIAGNOSIS — J9602 Acute respiratory failure with hypercapnia: Secondary | ICD-10-CM

## 2014-10-12 DIAGNOSIS — I369 Nonrheumatic tricuspid valve disorder, unspecified: Secondary | ICD-10-CM

## 2014-10-12 DIAGNOSIS — J9600 Acute respiratory failure, unspecified whether with hypoxia or hypercapnia: Secondary | ICD-10-CM | POA: Insufficient documentation

## 2014-10-12 DIAGNOSIS — I4892 Unspecified atrial flutter: Secondary | ICD-10-CM

## 2014-10-12 DIAGNOSIS — Z515 Encounter for palliative care: Secondary | ICD-10-CM

## 2014-10-12 DIAGNOSIS — R531 Weakness: Secondary | ICD-10-CM

## 2014-10-12 LAB — BASIC METABOLIC PANEL
Anion gap: 5 (ref 5–15)
BUN: 19 mg/dL (ref 6–23)
CHLORIDE: 92 meq/L — AB (ref 96–112)
CO2: 42 mmol/L (ref 19–32)
Calcium: 7.9 mg/dL — ABNORMAL LOW (ref 8.4–10.5)
Creatinine, Ser: 1.13 mg/dL (ref 0.50–1.35)
GFR calc Af Amer: 67 mL/min — ABNORMAL LOW (ref >90)
GFR calc non Af Amer: 58 mL/min — ABNORMAL LOW (ref >90)
Glucose, Bld: 95 mg/dL (ref 70–99)
POTASSIUM: 3.7 mmol/L (ref 3.5–5.1)
SODIUM: 139 mmol/L (ref 135–145)

## 2014-10-12 LAB — GLUCOSE, CAPILLARY
GLUCOSE-CAPILLARY: 93 mg/dL (ref 70–99)
GLUCOSE-CAPILLARY: 143 mg/dL — AB (ref 70–99)
Glucose-Capillary: 86 mg/dL (ref 70–99)
Glucose-Capillary: 94 mg/dL (ref 70–99)

## 2014-10-12 LAB — BLOOD GAS, ARTERIAL
Acid-Base Excess: 17.4 mmol/L — ABNORMAL HIGH (ref 0.0–2.0)
BICARBONATE: 43.2 meq/L — AB (ref 20.0–24.0)
Drawn by: 419771
O2 Content: 6 L/min
O2 Saturation: 89.9 %
PH ART: 7.404 (ref 7.350–7.450)
PO2 ART: 62.6 mmHg — AB (ref 80.0–100.0)
Patient temperature: 98.6
TCO2: 45.3 mmol/L (ref 0–100)
pCO2 arterial: 70.4 mmHg (ref 35.0–45.0)

## 2014-10-12 MED ORDER — LORAZEPAM 2 MG/ML IJ SOLN
INTRAMUSCULAR | Status: AC
  Administered 2014-10-12: 0.5 mg via INTRAVENOUS
  Filled 2014-10-12: qty 1

## 2014-10-12 MED ORDER — CETYLPYRIDINIUM CHLORIDE 0.05 % MT LIQD
7.0000 mL | Freq: Two times a day (BID) | OROMUCOSAL | Status: DC
  Administered 2014-10-13 – 2014-10-14 (×4): 7 mL via OROMUCOSAL

## 2014-10-12 MED ORDER — LORAZEPAM 0.5 MG PO TABS
0.5000 mg | ORAL_TABLET | Freq: Once | ORAL | Status: DC
  Filled 2014-10-12: qty 1

## 2014-10-12 MED ORDER — CHLORHEXIDINE GLUCONATE 0.12 % MT SOLN
15.0000 mL | Freq: Two times a day (BID) | OROMUCOSAL | Status: DC
  Administered 2014-10-12 – 2014-10-14 (×4): 15 mL via OROMUCOSAL
  Filled 2014-10-12 (×4): qty 15

## 2014-10-12 MED ORDER — PIPERACILLIN-TAZOBACTAM 3.375 G IVPB
3.3750 g | Freq: Three times a day (TID) | INTRAVENOUS | Status: DC
  Administered 2014-10-12 – 2014-10-14 (×6): 3.375 g via INTRAVENOUS
  Filled 2014-10-12 (×8): qty 50

## 2014-10-12 MED ORDER — DILTIAZEM HCL 25 MG/5ML IV SOLN
10.0000 mg | Freq: Four times a day (QID) | INTRAVENOUS | Status: DC | PRN
  Filled 2014-10-12: qty 5

## 2014-10-12 MED ORDER — LORAZEPAM 2 MG/ML IJ SOLN
0.5000 mg | Freq: Once | INTRAMUSCULAR | Status: AC
  Administered 2014-10-12 – 2014-10-13 (×2): 0.5 mg via INTRAVENOUS
  Filled 2014-10-12: qty 1

## 2014-10-12 MED ORDER — METHYLPREDNISOLONE SODIUM SUCC 125 MG IJ SOLR
80.0000 mg | Freq: Two times a day (BID) | INTRAMUSCULAR | Status: DC
  Administered 2014-10-12 – 2014-10-14 (×5): 80 mg via INTRAVENOUS
  Filled 2014-10-12 (×3): qty 1.28
  Filled 2014-10-12: qty 2
  Filled 2014-10-12: qty 1.28
  Filled 2014-10-12 (×3): qty 2

## 2014-10-12 NOTE — Progress Notes (Signed)
Took pt off of BiPAP and placed on 4L [place] pt is tolerating well at this time, BiPAP is on standby in the room. Pt slightly confused as to where he is and how he got to the hospital. RT will continue to monitor.

## 2014-10-12 NOTE — Progress Notes (Addendum)
Critical results CO2 = 42.  PCO2 = 70.4.  Physician's ordered to transfer pt to 2H06, as pt's condition had deteriorated, 6L O2 , with O2 saturation at 92%.  After numerous attempts, was not allowed to give report via phone to receiving nurse d/t phone lines in 2H unit being constantly busy.  Transferred pt to 2H and gave report to receiving nurse on unit, as physician had ordered.

## 2014-10-12 NOTE — Progress Notes (Signed)
  Echocardiogram 2D Echocardiogram has been performed.  Arvil ChacoFoster, Liesl Simons 10/12/2014, 10:29 AM

## 2014-10-12 NOTE — Progress Notes (Signed)
CSW order in place- patient [facility]  Palliative Care currently invovled with patient. CSW will talk to patient's wife in the a.m.  Facility aware and will accept patient back when medically stable.  SW Brief Psychosocial Assessment to follow. Fl2 initiated.  Lorri Frederickonna T. Jaci LazierCrowder, KentuckyLCSW [phone]

## 2014-10-12 NOTE — Progress Notes (Signed)
Pt was very lethargic this AM, O2 sat was 90-93% on 6L. K.Kirby was notified and ordered for stat blood gas.

## 2014-10-12 NOTE — Progress Notes (Addendum)
[facility] PROGRESS NOTE  Glen BarrenSamuel V Capaldi ZOX:[id number] DOB: 1928-03-19 DOA: 10/10/2014 PCP: Georgann HousekeeperHUSAIN,KARRAR, MD  Assessment/Plan: Principal Problem:   Acute CHF (congestive heart failure) Active Problems:   Chronic atrial fibrillation   Anemia   Diabetes mellitus due to underlying condition with hyperglycemia      Assessment and plan 1. Acute respiratory hypercapnic failure secondary to Acute CHF /COPD exacerbation- due to worsening symptoms the patient was transferred to step down unit this morning and placed on BiPAP. Discussed with the patient's wife and 2 daughters, patient is at high risk of recurrent aspiration. Patient is noncompliant with CPap at home. Patient has had multiple hospitalizations for the same. Family is agreeable to palliative care consultation and taking patient home on hospice. They confirmed that the patient is a DO NOT RESUSCITATE. Heart failure pathway. 2. Continue Lasix 80mg  IV 3 TIMES A DAY, 3. Strict intake and output 4. 2D echo pending 5. Continue patient on step down 6. Palliative care consultation for hospice eligibility 7. Patient has been made nothing by mouth 8. Started patient on Solu-Medrol for COPD exacerbation 9. Also started the patient on Zosyn for suspected left lower lobe infiltrate, x-ray concerning for pneumonitis, possibly another aspiration event 2. A.Flutter - (has long history of chronic a.fib at baseline) 1. Controlled rate 2. Change by mouth Cardizem to IV Cardizem when necessary for heart rate greater than 100 3. Anemia -  1. Repeat CBC 2. Hemoccult ordered 3. Was due to gross hematuria back in Dec. 4. Hematuria has resolved  History of generalized seizure Evaluated during last admission, continue Keppra  Hypertension Blood pressure soft Discontinue lisinopril  Metabolic encephalopathy Secondary to hypercapnic respiratory failure Discontinued cetirizine, Prozac, gabapentin, hydroxyzine, Ativan, Percocet,  tramadol  Reason history of aspiration pneumonia Patient has completed his course of antibiotic no indication for repeat antibiotics at this time  History of AAA Recent CT of the abdomen and pelvis with descending thoracic aortic aneurysm of 6.0 x 5.8 cm, no definite evidence of intramural hematoma   Diabetes hOld metformin Start the patient on sliding scale insulin Continue NovoLog SSI and Lantus  Code Status: DO NOT RESUSCITATE Family Communication: family updated about patient's clinical progress Disposition Plan:  As above    Brief narrative: Glen Miller is a 81 y.o. male who presents to the ED with c/o SOB. Symptoms are associated with cough but patient is a very poor historian and dosent notice much. Family denies known CP, fever, N/V/D. He has had cough which sounds productive. Patient is regularly on 2L home O2 AT baseline for COPD, at home patients O2 sats gradually decreased today. They called his PCP who told them to increase his O2 at home. Sats reportedly improved to 90% but no higher so family brought him in to the ED for being more lethargic than baseline.  Consultants:  Palliative care  Procedures:  BiPAP  Antibiotics: Zosyn  HPI/Subjective: Patient wants to be more lethargic last night, order requirements up to 6 L, ABG showed a PCO2 of 70, patient transferred to step down  Objective: Filed Vitals:   10/12/14 0755 10/12/14 0815 10/12/14 0830 10/12/14 0900  BP:  96/44 116/49 83/41  Pulse: 77 74 72 74  Temp:  97.9 F (36.6 C)    TempSrc:  Oral    Resp: 19 17 17 14   Height:      Weight:      SpO2: 96% 94% 94% 97%    Intake/Output Summary (Last 24 hours) at 10/12/14 1044  Last data filed at 10/12/14 0249  Gross per 24 hour  Intake    400 ml  Output   3496 ml  Net  -3096 ml    Exam:  General: Lethargic, somnolent Cardiovascular: RRR, nl S1 s2  Respiratory: Decreased breath sounds at the bases, scattered rhonchi, no crackles  Abdomen:  soft +BS NT/ND, no masses palpable  Extremities: No cyanosis and no edema      Data Reviewed: Basic Metabolic Panel:  Recent Labs Lab 10/10/14 2327 10/12/14 0611  NA 142 139  K 4.8 3.7  CL 97 92*  CO2 30 42*  GLUCOSE 172* 95  BUN 22 19  CREATININE 1.35 1.13  CALCIUM 8.7 7.9*    Liver Function Tests:  Recent Labs Lab 10/10/14 2327  AST 15  ALT 8  ALKPHOS 62  BILITOT 0.6  PROT 5.2*  ALBUMIN 2.6*   No results for input(s): LIPASE, AMYLASE in the last 168 hours. No results for input(s): AMMONIA in the last 168 hours.  CBC:  Recent Labs Lab 10/10/14 2327  WBC 7.1  NEUTROABS 5.6  HGB 8.3*  HCT 28.4*  MCV 83.5  PLT 190    Cardiac Enzymes:  Recent Labs Lab 10/10/14 2327 10/11/14 2004  TROPONINI <0.03 <0.03   BNP (last 3 results) No results for input(s): PROBNP in the last 8760 hours.   CBG:  Recent Labs Lab 10/11/14 0824 10/11/14 1649 10/11/14 2145 10/12/14 0628 10/12/14 0818  GLUCAP 155* 133* 189* 93 86    Recent Results (from the past 240 hour(s))  MRSA PCR Screening     Status: None   Collection Time: 10/11/14  7:35 PM  Result Value Ref Range Status   MRSA by PCR NEGATIVE NEGATIVE Final    Comment:        The GeneXpert MRSA Assay (FDA approved for NASAL specimens only), is one component of a comprehensive MRSA colonization surveillance program. It is not intended to diagnose MRSA infection nor to guide or monitor treatment for MRSA infections.      Studies: Ct Abdomen Pelvis Wo Contrast  09/15/2014   CLINICAL DATA:  Gross hematuria, anemia, history kidney stones, hypertension, diabetes, atrial fibrillation, COPD  EXAM: CT ABDOMEN AND PELVIS WITHOUT CONTRAST  TECHNIQUE: Multidetector CT imaging of the abdomen and pelvis was performed following the standard protocol without IV contrast.  COMPARISON:  09/25/2005  FINDINGS: Bibasilar atelectasis and small LEFT pleural effusion.  Aneurysmal dilatation of the descending thoracic  aorta, 5.7 cm transverse at the proximal descending aorta, 6.0 x 5.8 cm diameter at mid descending aorta, and 5.0 cm at distal ascending aorta.  Scattered atherosclerotic changes throughout abdominal aorta, iliac and coronary arteries.  No evidence of intramural hematoma or perianeurysmal hemorrhage.  BILATERAL minimal collecting system dilatation with significant chronic perinephric and peripelvic edema at both kidneys.  Tiny nonobstructing BILATERAL renal calculi.  No definite ureteral calcification or dilatation.  Gallbladder surgically absent.  Liver, spleen, pancreas, and adrenal glands unremarkable for technique.  Normal appendix.  Foley catheter decompresses urinary bladder with note of a tiny amount of high attenuation material adjacent to the Foley catheter balloon, question tiny bladder calculus.  Distal colonic diverticulosis without evidence of diverticulitis.  Stomach and bowel loops otherwise grossly unremarkable for technique.  Low-attenuation circulating blood question anemia.  Small supraumbilical hernia containing fat.  Tiny umbilical and RIGHT inguinal hernia containing fat.  Bones demineralized with multilevel degenerative disc disease changes of the thoracolumbar spine.  IMPRESSION: Significant atherosclerotic changes with aneurysmal dilatation  of the descending thoracic aorta achieving greatest size of 6.0 x 5.8 cm at the mid descending aorta.  No definite evidence of intramural hematoma or perianeurysmal hemorrhage.  Minimal chronic collecting system dilatation in both kidneys with tiny nonobstructing calculi and chronic infiltration of peripelvic and perinephric fat planes, stable since 2006, question sequela of previous inflammation or hemorrhage.  Questionable small bladder calculus.  Supraumbilical, umbilical and RIGHT inguinal hernias containing fat.  Sigmoid diverticulosis without diverticulitis.   Electronically Signed   By: Ulyses Southward M.D.   On: 09/15/2014 16:06   Dg Chest 2  View  10/11/2014   CLINICAL DATA:  Shortness of breath.  History of COPD.  EXAM: CHEST  2 VIEW  COMPARISON:  09/12/2014  FINDINGS: Patient is post median sternotomy. Cardiomegaly and tortuous thoracic aorta is again seen. There is mild vascular congestion and question upper lobe pulmonary edema. Bilateral pleural effusions, left greater than right, with associated bibasilar airspace disease.  IMPRESSION: 1. Vascular congestion and question upper lobe pulmonary edema. In combination with bilateral pleural effusions, question CHF. 2. Cardiomegaly and tortuous thoracic aorta, not significantly changed from prior.   Electronically Signed   By: Rubye Oaks M.D.   On: 10/11/2014 00:21   Mr Brain Wo Contrast  09/13/2014   CLINICAL DATA:  Involuntary tremors. Altered mental status with dementia, Initial encounter. The exam was truncated prematurely due to low O2 saturation.  EXAM: MRI HEAD WITHOUT CONTRAST  TECHNIQUE: Multiplanar, multiecho pulse sequences of the brain and surrounding structures were obtained without intravenous contrast.  COMPARISON:  CT head 09/12/2014  FINDINGS: The patient was unable to remain motionless for the exam. Small or subtle lesions could be overlooked.  No restricted diffusion, or visible acute/chronic hemorrhage. Generalized atrophy. T2 and FLAIR hyperintensity of the periventricular and subcortical white matter, also affecting the brainstem, likely chronic microvascular ischemic change. Hydrocephalus ex vacuo. No extra-axial fluid. Grossly negative orbits and sinuses.  IMPRESSION: Truncated, motion degraded exam, demonstrating no definite acute intracranial findings. Good general agreement with yesterday CT scan.   Electronically Signed   By: Davonna Belling M.D.   On: 09/13/2014 10:49   Dg Chest Port 1v Same Day  10/11/2014   CLINICAL DATA:  Shortness of breath, low oxygen saturation, congestion, history hypertension, diabetes, COPD, CHF, atrial fibrillation  EXAM: PORTABLE CHEST  - 1 VIEW SAME DAY  COMPARISON:  Portable exam 2006 hr compared to 10/10/2014  FINDINGS: Enlargement of cardiac silhouette post median sternotomy.  Tortuous aorta with atherosclerotic calcification.  Rotated to the RIGHT.  Partial atelectasis in RIGHT upper lobe.  Infiltrate LEFT lower lobe.  Small pleural effusions seen on previous exam radiographically inapparent.  No pneumothorax.  Bones demineralized.  IMPRESSION: Enlargement of cardiac silhouette.  Increased atelectasis in RIGHT upper lobe with mild LEFT lower lobe infiltrate.   Electronically Signed   By: Ulyses Southward M.D.   On: 10/11/2014 23:28    Scheduled Meds: . celecoxib  200 mg Oral Daily  . diltiazem  360 mg Oral Daily  . furosemide  80 mg Intravenous TID  . insulin aspart  0-9 Units Subcutaneous TID WC  . insulin glargine  30 Units Subcutaneous QHS  . isosorbide mononitrate  30 mg Oral Daily  . levETIRAcetam  500 mg Oral BID  . lisinopril  2.5 mg Oral Daily  . magnesium oxide  400 mg Oral BID  . methylPREDNISolone (SOLU-MEDROL) injection  80 mg Intravenous Q12H  . pantoprazole  40 mg Oral Daily  . piperacillin-tazobactam (  ZOSYN)  IV  3.375 g Intravenous Q8H  . potassium chloride SA  20 mEq Oral BID  . sodium chloride  3 mL Intravenous Q12H  . tamsulosin  0.4 mg Oral QPM   Continuous Infusions: . sodium chloride 10 mL/hr (10/10/14 2325)    Principal Problem:   Acute CHF (congestive heart failure) Active Problems:   Chronic atrial fibrillation   Anemia   Diabetes mellitus due to underlying condition with hyperglycemia    Time spent: 40 minutes   [facility]  [facility] Pager [phone]. If 7PM-7AM, please contact night-coverage at [email or web address], password [facility] 10/12/2014, 10:44 AM  LOS: 2 days

## 2014-10-12 NOTE — Progress Notes (Addendum)
Text/paged Dr. Susie CassetteAbrol to inform of critical result of the following: CO2 = 42 PCO2 = 70.4

## 2014-10-12 NOTE — Care Management Note (Addendum)
    Page 1 of 2   10/13/2014     10:15:37 AM CARE MANAGEMENT NOTE 10/13/2014  Patient:  Glen Miller,Glen Miller   Account Number:  [id number]  Date Initiated:  10/12/2014  Documentation initiated by:  Glen CreamerWELL,Glen  Subjective/Objective Assessment:   adm w shortness of breath     Action/Plan:   lives w wife, pcp dr Glen Miller, went to snf after last hospitalization   Anticipated DC Date:  10/14/2014   Anticipated DC Plan:  HOME W HOSPICE CARE  In-house referral  Clinical Social Worker      DC Associate Professorlanning Services  CM consult      PAC Choice  HOSPICE   Choice offered to / List presented to:  C-4 Adult Children        HH arranged  HH-1 RN  HH-6 SOCIAL WORKER      HH [facility] [place]   Status of service:   Medicare Important Message given?  YES (If response is "NO", the following Medicare IM given date fields will be blank) Date Medicare IM given:  10/13/2014 Medicare IM given by:  Glen CreamerWELL,Glen Date Additional Medicare IM given:   Additional Medicare IM given by:    Discharge Disposition:  HOME W HOSPICE CARE  Per UR Regulation:  Reviewed for med. necessity/level of care/duration of stay  If discussed at Long Length of Stay Meetings, dates discussed:    Comments:  1/13 1004 Glen Glen Durr rn,bsn recieved ref from [facility] to arrange home hospice. tent dc planned for 1/14. spoke w da and gav elist of hospice agencies no pref but agreeable to hospice and paliative care of Glen Miller. pt has [facility] o2 w [facility] who [facility] w [facility] and [facility] [place]. Glen Miller w hospice will see fam and assist w other eq needs. will alert sw will need amb transport at disch.

## 2014-10-12 NOTE — Consult Note (Signed)
Patient [name]      DOB: Apr 29, 1928      EXN:[id number]     Consult Note from the Palliative Medicine Team at [facility] Requested by: Dr. Allyson Miller     PCP: Glen Low, MD Reason for Consultation: [place]     Phone Number:[phone]  Assessment of patients Current state: I met today with Glen Miller wife and daughter at bedside. They are very tearful and reminiscent of him as a husband and father - they have been married 78 yrs and have 2 daughters. Daughter tells how he was always a wonderful father even though there were times in their youth they didn't think so. 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 They also talk about how he has declined overall in health and in his dementia and how the past few months have been hard for him. They have been staying with him around the clock at [facility]. They both agree that they do not think his body is strong enough to thrive at this point. Comfort is a priority. They wish to continue BiPAP, antibiotics, gentle support to see if they can get him any better/awake. They tell me they would like to take him home if we can get him well enough - at this point I am not sure he will survive this admission. Family is very reasonable and realistic.   Goals of Care: 1.  Code Status: DNR   2. Scope of Treatment: Continue gentle supportive treatment including IVF, medications, and BiPAP for today with the hopes of small improvement to possible be able to get home. Not sure this will be possible.    4. Disposition: To be determined on outcomes.    3. Symptom Management:   1. Altered mental status: Continue BiPAP in hopes to reverse his hypercapnea and increase alertness.  2. Pain: Utilize acetaminophen for pain prn.  3. Weakness: Continue supportive care with hopes of improvement at least enough to go home.   4. Psychosocial: Emotional  support provided to patient and to family who are very tearful at bedside.    Brief HPI: 81 yo male admitted with shortness of breath r/t acute CHF (swelling improved with Lasix) and COPD with recent aspiration pneumonia. PMH significant for dementia, HTN, diabetes mellitus, COPD, Afib, AAA, seizures.    ROS: Unable to assess - encephalopathy.     PMH:  Past Medical History  Diagnosis Date  . Hypertension   . Diabetes mellitus   . COPD (chronic obstructive pulmonary disease)   . Dementia   . Atrial fibrillation   . AAA (abdominal aortic aneurysm) without rupture 09/16/2014     PSH: Past Surgical History  Procedure Laterality Date  . Kidney stones    . Back surgery    . Exploration post operative open heart    . Triple bypass    . Cholecystectomy    . Knee arthroscopy    . Appendectomy    . Eye surgery    . Joint replacement     I have reviewed the [place] and [facility] and  If appropriate update it with new information. No Known Allergies Scheduled Meds: . celecoxib  200 mg Oral Daily  . diltiazem  360 mg Oral Daily  . furosemide  80 mg Intravenous TID  . insulin aspart  0-9 Units Subcutaneous TID WC  . insulin glargine  30  Units Subcutaneous QHS  . isosorbide mononitrate  30 mg Oral Daily  . levETIRAcetam  500 mg Oral BID  . lisinopril  2.5 mg Oral Daily  . magnesium oxide  400 mg Oral BID  . methylPREDNISolone (SOLU-MEDROL) injection  80 mg Intravenous Q12H  . pantoprazole  40 mg Oral Daily  . piperacillin-tazobactam (ZOSYN)  IV  3.375 g Intravenous Q8H  . potassium chloride SA  20 mEq Oral BID  . sodium chloride  3 mL Intravenous Q12H  . tamsulosin  0.4 mg Oral QPM   Continuous Infusions: . sodium chloride 10 mL/hr (10/10/14 2325)   PRN Meds:.sodium chloride, acetaminophen, ipratropium-albuterol, nitroGLYCERIN, ondansetron (ZOFRAN) IV, promethazine, sodium chloride    BP 83/41 mmHg  Pulse 74  Temp(Src) 97.9 F (36.6 C) (Oral)  Resp 14  Ht 5' 10"  (1.778 m)  Wt  92.262 kg (203 lb 6.4 oz)  BMI 29.18 kg/m2  SpO2 97%   PPS: 10%   Intake/Output Summary (Last 24 hours) at 10/12/14 0947 Last data filed at 10/12/14 0249  Gross per 24 hour  Intake    400 ml  Output   3496 ml  Net  -3096 ml   LBM: 1/11  Physical Exam:  General: Somnolent, on BiPAP HEENT: [place]/AT Chest: Diminished bases CVS: Irreg Abdomen: Soft, NT, ND, +BS Ext: MAE, BLE 2+ edema Neuro: Arousable but only briefly before falls back asleep  Labs: CBC    Component Value Date/Time   WBC 7.1 10/10/2014 2327   RBC 3.40* 10/10/2014 2327   RBC 3.46* 09/15/2014 0340   HGB 8.3* 10/10/2014 2327   HCT 28.4* 10/10/2014 2327   PLT 190 10/10/2014 2327   MCV 83.5 10/10/2014 2327   MCH 24.4* 10/10/2014 2327   MCHC 29.2* 10/10/2014 2327   RDW 17.8* 10/10/2014 2327   LYMPHSABS 0.8 10/10/2014 2327   MONOABS 0.6 10/10/2014 2327   EOSABS 0.1 10/10/2014 2327   BASOSABS 0.0 10/10/2014 2327    BMET    Component Value Date/Time   NA 139 10/12/2014 0611   K 3.7 10/12/2014 0611   CL 92* 10/12/2014 0611   CO2 42* 10/12/2014 0611   GLUCOSE 95 10/12/2014 0611   BUN 19 10/12/2014 0611   CREATININE 1.13 10/12/2014 0611   CALCIUM 7.9* 10/12/2014 0611   GFRNONAA 58* 10/12/2014 0611   GFRAA 67* 10/12/2014 0611    CMP     Component Value Date/Time   NA 139 10/12/2014 0611   K 3.7 10/12/2014 0611   CL 92* 10/12/2014 0611   CO2 42* 10/12/2014 0611   GLUCOSE 95 10/12/2014 0611   BUN 19 10/12/2014 0611   CREATININE 1.13 10/12/2014 0611   CALCIUM 7.9* 10/12/2014 0611   PROT 5.2* 10/10/2014 2327   ALBUMIN 2.6* 10/10/2014 2327   AST 15 10/10/2014 2327   ALT 8 10/10/2014 2327   ALKPHOS 62 10/10/2014 2327   BILITOT 0.6 10/10/2014 2327   GFRNONAA 58* 10/12/2014 0611   GFRAA 67* 10/12/2014 0611    Time In Time Out Total Time Spent with Patient Total Overall Time  0845 0955 32mn 799m    Greater than 50%  of this time was spent counseling and coordinating care related to the above  assessment and plan.  Glen SillNP Palliative Medicine Team Pager # [phone]-F 8a-5p) Team Phone # [phone]/Weekends)

## 2014-10-13 DIAGNOSIS — R451 Restlessness and agitation: Secondary | ICD-10-CM | POA: Diagnosis not present

## 2014-10-13 DIAGNOSIS — R0602 Shortness of breath: Secondary | ICD-10-CM

## 2014-10-13 LAB — CBC
HEMATOCRIT: 30.7 % — AB (ref 39.0–52.0)
Hemoglobin: 9 g/dL — ABNORMAL LOW (ref 13.0–17.0)
MCH: 24.3 pg — AB (ref 26.0–34.0)
MCHC: 29.3 g/dL — ABNORMAL LOW (ref 30.0–36.0)
MCV: 83 fL (ref 78.0–100.0)
Platelets: 210 10*3/uL (ref 150–400)
RBC: 3.7 MIL/uL — ABNORMAL LOW (ref 4.22–5.81)
RDW: 17.7 % — ABNORMAL HIGH (ref 11.5–15.5)
WBC: 5.2 10*3/uL (ref 4.0–10.5)

## 2014-10-13 LAB — COMPREHENSIVE METABOLIC PANEL
ALBUMIN: 2.8 g/dL — AB (ref 3.5–5.2)
ALT: 10 U/L (ref 0–53)
AST: 14 U/L (ref 0–37)
Alkaline Phosphatase: 66 U/L (ref 39–117)
Anion gap: 19 — ABNORMAL HIGH (ref 5–15)
BUN: 27 mg/dL — ABNORMAL HIGH (ref 6–23)
CO2: 35 mmol/L — ABNORMAL HIGH (ref 19–32)
CREATININE: 1.65 mg/dL — AB (ref 0.50–1.35)
Calcium: 8.4 mg/dL (ref 8.4–10.5)
Chloride: 87 mEq/L — ABNORMAL LOW (ref 96–112)
GFR calc Af Amer: 42 mL/min — ABNORMAL LOW (ref >90)
GFR calc non Af Amer: 37 mL/min — ABNORMAL LOW (ref >90)
Glucose, Bld: 193 mg/dL — ABNORMAL HIGH (ref 70–99)
POTASSIUM: 3.7 mmol/L (ref 3.5–5.1)
Sodium: 141 mmol/L (ref 135–145)
Total Bilirubin: 1.4 mg/dL — ABNORMAL HIGH (ref 0.3–1.2)
Total Protein: 5.9 g/dL — ABNORMAL LOW (ref 6.0–8.3)

## 2014-10-13 LAB — GLUCOSE, CAPILLARY
GLUCOSE-CAPILLARY: 173 mg/dL — AB (ref 70–99)
GLUCOSE-CAPILLARY: 187 mg/dL — AB (ref 70–99)

## 2014-10-13 MED ORDER — LEVETIRACETAM IN NACL 500 MG/100ML IV SOLN
500.0000 mg | Freq: Two times a day (BID) | INTRAVENOUS | Status: DC
  Administered 2014-10-13 – 2014-10-14 (×3): 500 mg via INTRAVENOUS
  Filled 2014-10-13 (×4): qty 100

## 2014-10-13 MED ORDER — FUROSEMIDE 10 MG/ML IJ SOLN
60.0000 mg | Freq: Two times a day (BID) | INTRAMUSCULAR | Status: DC
  Administered 2014-10-13 – 2014-10-14 (×2): 60 mg via INTRAVENOUS
  Filled 2014-10-13 (×3): qty 6

## 2014-10-13 MED ORDER — DILTIAZEM HCL ER COATED BEADS 360 MG PO CP24
360.0000 mg | ORAL_CAPSULE | Freq: Every day | ORAL | Status: DC
  Administered 2014-10-13 – 2014-10-14 (×2): 360 mg via ORAL
  Filled 2014-10-13: qty 1

## 2014-10-13 MED ORDER — PANTOPRAZOLE SODIUM 40 MG IV SOLR
40.0000 mg | INTRAVENOUS | Status: DC
  Administered 2014-10-13 – 2014-10-14 (×2): 40 mg via INTRAVENOUS
  Filled 2014-10-13 (×2): qty 40

## 2014-10-13 MED ORDER — HALOPERIDOL LACTATE 2 MG/ML PO CONC: 2.0000 mg | Freq: Four times a day (QID) | ORAL | Status: DC | PRN

## 2014-10-13 MED ORDER — LORAZEPAM 2 MG/ML IJ SOLN
INTRAMUSCULAR | Status: AC
  Administered 2014-10-13: 1 mg
  Filled 2014-10-13: qty 1

## 2014-10-13 MED ORDER — MORPHINE SULFATE (CONCENTRATE) 10 MG/0.5ML PO SOLN
5.0000 mg | ORAL | Status: DC | PRN
  Administered 2014-10-13 – 2014-10-14 (×5): 5 mg via ORAL
  Filled 2014-10-13 (×5): qty 0.5

## 2014-10-13 MED ORDER — ENOXAPARIN SODIUM 40 MG/0.4ML ~~LOC~~ SOLN
40.0000 mg | SUBCUTANEOUS | Status: DC
  Administered 2014-10-13: 40 mg via SUBCUTANEOUS
  Filled 2014-10-13 (×2): qty 0.4

## 2014-10-13 MED ORDER — HALOPERIDOL 2 MG PO TABS
2.0000 mg | ORAL_TABLET | Freq: Four times a day (QID) | ORAL | Status: DC | PRN
  Administered 2014-10-13 – 2014-10-14 (×3): 2 mg via ORAL
  Filled 2014-10-13 (×4): qty 1

## 2014-10-13 NOTE — Progress Notes (Signed)
To my knowledge, pt's family remained with the patient throughout the day. Around 2200 10/12/13 pt began to get worked up, but was easy to reorient and calm down using calming words. Around 2300 pt began to get violently confused, screaming and punching and pulling at different wires. Pt also pulled out his only IV access. At this time I called the family and notified his daughter of the current situation and recommended that she come sit with him tonight, which she agreed to. I also received an order for a 1 time dose of 0.5mg  IV ativan which was effective. Around 0130 pt began to get violent and I received another order for a 1 time dose of 0.5mg  IV ativan which was temporarily effective. At 0330 pt began to get violent another time, and I received an order for a 1 time dose of 1mg  IV ativan. Since then, patient has been combative on and off. He has mitts on for his protection. Family is still at the bedside. A sitter at night would be highly recommended. I will continue to monitor.

## 2014-10-13 NOTE — Progress Notes (Signed)
Pharmacy note: lovenox  81 yo male with respiratory failure and chronic afib. Pharmacy has been asked to dose lovenox for VTE prophylaxis. Hg/hct= 9/30.7, plt= 210, SCr= 1.65 (trend up) with CrCl ~ 40.  Plan -Lovenox 40mg  sq q24hr -Will adjust lovenox based on renal function if needed  Harland Germanndrew Liberato Stansbery, Ilda Bassetharm D 10/13/2014 12:39 PM

## 2014-10-13 NOTE — Progress Notes (Signed)
PT Cancellation Note  Patient Details Name: Glen Miller MRN: [id number] DOB: Jun 21, 1928   Cancelled Treatment:    Reason Eval/Treat Not Completed: Medical issues which prohibited therapy (Pt agitated all night and now asleep on bipap)   Glen Miller 10/13/2014, 9:17 AM  [employer] [name] PT [phone]

## 2014-10-13 NOTE — Progress Notes (Addendum)
Progress Note from the Palliative Medicine Team at [facility]: I met today again with Glen Miller daughter, Butch Penny, who lives with her parents to help out. She confirms that they have decided that they are prepared to take Glen Miller home to live out the rest of his days as little as that may be. We discussed hospice and comfort and they would like to continue BiPAP as needed only because he appears comfortable with the BiPAP and also continue antibiotics until discharge. It is very important to them that he is able to get home so I recommended to proceed as soon as possible and Butch Penny agrees to tomorrow. They clearly want comfort and tell me that he has been tired and he is ready to die.    Objective: Allergies  Allergen Reactions  . Ativan [Lorazepam] Anxiety    Increases agitation per daughter    Scheduled Meds: . antiseptic oral rinse  7 mL Mouth Rinse q12n4p  . chlorhexidine  15 mL Mouth Rinse BID  . diltiazem  360 mg Oral Daily  . furosemide  60 mg Intravenous Q12H  . levETIRAcetam  500 mg Oral BID  . methylPREDNISolone (SOLU-MEDROL) injection  80 mg Intravenous Q12H  . pantoprazole (PROTONIX) IV  40 mg Intravenous Q24H  . piperacillin-tazobactam (ZOSYN)  IV  3.375 g Intravenous Q8H   Continuous Infusions:  PRN Meds:.acetaminophen, diltiazem, haloperidol, ipratropium-albuterol, morphine CONCENTRATE, nitroGLYCERIN, ondansetron (ZOFRAN) IV  BP 111/44 mmHg  Pulse 85  Temp(Src) 98.7 F (37.1 C) (Axillary)  Resp 28  Ht 5' 10"  (1.778 m)  Wt 93.2 kg (205 lb 7.5 oz)  BMI 29.48 kg/m2  SpO2 96%   PPS: 10%   Intake/Output Summary (Last 24 hours) at 10/13/14 0950 Last data filed at 10/13/14 0800  Gross per 24 hour  Intake  237.5 ml  Output   2400 ml  Net -2162.5 ml      LBM: 1/12  Physical Exam:  General: Somnolent, on BiPAP HEENT: [place]/AT Chest: No labored breathing, symmetric CVS: Irreg Abdomen: Soft, NT, ND, +BS Ext: MAE, BLE 2+ edema Neuro: Somnolent,  minimally arousable   Labs: CBC    Component Value Date/Time   WBC 5.2 10/13/2014 0213   RBC 3.70* 10/13/2014 0213   RBC 3.46* 09/15/2014 0340   HGB 9.0* 10/13/2014 0213   HCT 30.7* 10/13/2014 0213   PLT 210 10/13/2014 0213   MCV 83.0 10/13/2014 0213   MCH 24.3* 10/13/2014 0213   MCHC 29.3* 10/13/2014 0213   RDW 17.7* 10/13/2014 0213   LYMPHSABS 0.8 10/10/2014 2327   MONOABS 0.6 10/10/2014 2327   EOSABS 0.1 10/10/2014 2327   BASOSABS 0.0 10/10/2014 2327    BMET    Component Value Date/Time   NA 141 10/13/2014 0213   K 3.7 10/13/2014 0213   CL 87* 10/13/2014 0213   CO2 35* 10/13/2014 0213   GLUCOSE 193* 10/13/2014 0213   BUN 27* 10/13/2014 0213   CREATININE 1.65* 10/13/2014 0213   CALCIUM 8.4 10/13/2014 0213   GFRNONAA 37* 10/13/2014 0213   GFRAA 42* 10/13/2014 0213    CMP     Component Value Date/Time   NA 141 10/13/2014 0213   K 3.7 10/13/2014 0213   CL 87* 10/13/2014 0213   CO2 35* 10/13/2014 0213   GLUCOSE 193* 10/13/2014 0213   BUN 27* 10/13/2014 0213   CREATININE 1.65* 10/13/2014 0213   CALCIUM 8.4 10/13/2014 0213   PROT 5.9* 10/13/2014 0213   ALBUMIN 2.8* 10/13/2014 8786  AST 14 10/13/2014 0213   ALT 10 10/13/2014 0213   ALKPHOS 66 10/13/2014 0213   BILITOT 1.4* 10/13/2014 0213   GFRNONAA 37* 10/13/2014 0213   GFRAA 42* 10/13/2014 0213    Assessment and Plan: 1. Code Status: DNR 2. Symptom Control: 1. Anxiety/agitation: Haldol 2 mg every 6 hours prn. He does not tolerate ativan per family. Will trial haldol. 2. Pain/dyspnea/SOB: Roxanol 5 mg every hour prn.  3. Seizures: May consider scheduled valium (comes in concentrated solution) to prevent seizures. May not tolerate valium if does not tolerate ativan.  3. Psycho/Social: Emotional support provided to patient and family.  4. Disposition: Home with hospice tomorrow.     Time In Time Out Total Time Spent with Patient Total Overall Time  0905 0950 47mn 4101m    Greater than 50%  of this  time was spent counseling and coordinating care related to the above assessment and plan.  AlVinie SillNP Palliative Medicine Team Pager # [phone]-F 8a-5p) Team Phone # [phone]/Weekends)

## 2014-10-13 NOTE — Progress Notes (Addendum)
Notified by Glen Miller, patient and family request services of [facility] after discharge.  Writer spoke with pt's wife, Glen Miller, dtr, Glen Miller and sister at bedside to initiate education related to hospice services, philosophy and team approach to care; family tearful, they shared they understand Glen Miller's condition is very fragile and time is very limited; they state their goal is for him to be comfortable and pass at home. Pt has support of family at home 24/7; Dtr Glen Miller lives with parents and available to assist with care They are hopeful to put things in place for discharge tomorrow, [facility] and request foley catheter be left in place for comfort at discharge. Currently Glen Miller is tolerating being off BiPap and on 5 L [place] sats-93-94%; he is non responsive to voice or light touch, and today has been too lethargic to take any PO meds. Discussed medications at home for comfort and that discharging physician will be sure prescriptions are in place for family to take home **Discharge physician please see information from Palliative Team NP orders/ recommendations 10/13/13:  1) Liquid Concentrated Morphine 10 mg/0.675ml give 5 mg q 1 hour PRN pain, SOB dyspnea RR> 25 Note on prescription *Dispense Quantity = 30 ml* 2) Haloperidol 2mg /ml solution give 2 mg q 6 hr prn agition * family will need prescriptions for the above comfort medications *Please send completed GOLD OOF DNR Form home with pt  DME needs discussed: pt currently has O2 in the home through [employer]- [facility]- family to contact [employer] and request current O2 in the home be picked up  [facility] Glen [facility] the following: Complete O2 pkg B; pt currently on 5-6LNC, will need Nebulizer and suction set up; Complete pkg D: fully electric hospital bed with half upper and lower rails, AP&P mattress and over-bed  table;  Family is requesting delivery this evening to allow for pt to transport home tomorrow *[facility] to contact dtr Glen Miller at [phone] to arrange delivery time; address is correct in system  [facility]  * Completed d/c summary will need to be [facility]ed [facility] @ [phone] when final Please notify [facility] when patient is ready to leave unit at d/c call [phone] (or [phone] if after 5 pm); [facility] information and contact numbers also given to dtr, Glen Miller and Glen Miller during visit.   Above information shared with Glen CreamerDebbie Miller Glen Miller Please call with any questions or concerns   Glen DavidMargie Mohab Ashby, RN 10/13/2014, 12:29 PM [facility] RN Liaison  [phone]  *1:00pm Addendum daughter approached writer outside room and shared that they had spoken with the doctor who informed them that Glen Miller probably had an infection in his heart valve; dtr stated she and her mother have talked and they do not want any further work-up; he's been through a lot, we've thought we were going to lose him many times before; now it's time for comfort and we all agree moving forward and getting ready for him to come home for whatever time he has is the right thing to do.

## 2014-10-13 NOTE — Progress Notes (Signed)
Patient from [facility]. Family plans to take patient [facility]. Anticipate d/c tomorrow via EMS. CSW will arrange once d/c is confirmed.  Lorri Frederickonna T. Jaci LazierCrowder, KentuckyLCSW [phone]

## 2014-10-13 NOTE — Progress Notes (Addendum)
[facility] PROGRESS NOTE  Glen Miller WUX:[id number] DOB: 21-Oct-1927 DOA: 10/10/2014 PCP: Georgann HousekeeperHUSAIN,KARRAR, MD  Assessment/Plan: Principal Problem:   Acute CHF (congestive heart failure) Active Problems:   Chronic atrial fibrillation   Anemia   Diabetes mellitus due to underlying condition with hyperglycemia   Palliative care encounter   Weakness generalized   Respiratory failure, acute      Assessment and plan 1. Acute respiratory hypercapnic failure secondary to Acute CHF /COPD exacerbation in the setting of possible pneumonia- due to worsening symptoms the patient was transferred to step down unit this morning and placed on BiPAP. Discussed with the patient's wife and 2 daughters, patient is at high risk of recurrent aspiration. Patient is noncompliant with CPap at home. Patient has had multiple hospitalizations for the same. Family is agreeable to palliative care consultation and taking patient home on hospice. They confirmed that the patient is a DO NOT RESUSCITATE. Heart failure pathway. 2. Decrease Lasix to 60 mg IV every 12 because of increasing creatinine 3. Strict intake and output 4. 2D echo shows ejection fraction of 60% with diastolic dysfunction, moderate mitral regurgitation, possible vegetation on the tricuspid valve, and cardiologist recommends TEE, severe pulmonary hypertension, discussed with Olegario MessierKathy the patient's daughter, they do not want any further workup 5. Continue patient on step down 6. Palliative care consultation for hospice eligibility, patient already started on Roxanol 7. Continue nothing by mouth 8. Started patient on Solu-Medrol for COPD exacerbation 9. Also started the patient on Zosyn for suspected left lower lobe infiltrate, x-ray concerning for pneumonitis, possibly another aspiration event, repeat blood culture   2. A.Flutter - (has long history of chronic a.fib at baseline) 1. Controlled rate 2. Change by mouth Cardizem to IV Cardizem  when necessary for heart rate greater than 100   3. Anemia -  1. Repeat CBC 2. Hemoccult ordered 3. Was due to gross hematuria back in Dec. 4. Hematuria has resolved  History of generalized seizure Evaluated during last admission, change Keppra to IV  Hypertension Blood pressure soft Discontinue lisinopril  Metabolic encephalopathy Secondary to hypercapnic respiratory failure Discontinued cetirizine, Prozac, gabapentin, hydroxyzine, Ativan, Percocet, tramadol Prn haldol   Reason history of aspiration pneumonia Patient has completed his course of antibiotic no indication for repeat antibiotics at this time  History of AAA Recent CT of the abdomen and pelvis with descending thoracic aortic aneurysm of 6.0 x 5.8 cm, no definite evidence of intramural hematoma   Diabetes hold metformin Start the patient on sliding scale insulin Continue NovoLog SSI and Lantus  Code Status: DO NOT RESUSCITATE Family Communication: Tried to contact both the patient's daughters, discussed with  Disposition Plan:  As above    Brief narrative: Glen BarrenSamuel V Hegwood is a 81 y.o. male who presents to the ED with c/o SOB. Symptoms are associated with cough but patient is a very poor historian and dosent notice much. Family denies known CP, fever, N/V/D. He has had cough which sounds productive. Patient is regularly on 2L home O2 AT baseline for COPD, at home patients O2 sats gradually decreased today. They called his PCP who told them to increase his O2 at home. Sats reportedly improved to 90% but no higher so family brought him in to the ED for being more lethargic than baseline.  Consultants:  Palliative care  Procedures:  BiPAP  Antibiotics: Zosyn  HPI/Subjective: Apparently confused last night, screaming and punching and pulling and different wires, received Ativan 2, now on BiPAP and sedated.  Objective: Filed  Vitals:   10/13/14 0856 10/13/14 0900 10/13/14 1100 10/13/14 1138  BP:  111/44 105/50 114/58   Pulse: 85 72 73 66  Temp:      TempSrc:      Resp: Height:      Weight:      SpO2: 96% 100% 100% 97%    Intake/Output Summary (Last 24 hours) at 10/13/14 1212 Last data filed at 10/13/14 1038  Gross per 24 hour  Intake    220 ml  Output   2900 ml  Net  -2680 ml    Exam:  General: Lethargic, somnolent Cardiovascular: RRR, nl S1 s2  Respiratory: Decreased breath sounds at the bases, scattered rhonchi, no crackles  Abdomen: soft +BS NT/ND, no masses palpable  Extremities: No cyanosis and no edema      Data Reviewed: Basic Metabolic Panel:  Recent Labs Lab 10/10/14 2327 10/12/14 0611 10/13/14 0213  NA 142 139 141  K 4.8 3.7 3.7  CL 97 92* 87*  CO2 30 42* 35*  GLUCOSE 172* 95 193*  BUN 22 19 27*  CREATININE 1.35 1.13 1.65*  CALCIUM 8.7 7.9* 8.4    Liver Function Tests:  Recent Labs Lab 10/10/14 2327 10/13/14 0213  AST 15 14  ALT 8 10  ALKPHOS 62 66  BILITOT 0.6 1.4*  PROT 5.2* 5.9*  ALBUMIN 2.6* 2.8*   No results for input(s): LIPASE, AMYLASE in the last 168 hours. No results for input(s): AMMONIA in the last 168 hours.  CBC:  Recent Labs Lab 10/10/14 2327 10/13/14 0213  WBC 7.1 5.2  NEUTROABS 5.6  --   HGB 8.3* 9.0*  HCT 28.4* 30.7*  MCV 83.5 83.0  PLT 190 210    Cardiac Enzymes:  Recent Labs Lab 10/10/14 2327 10/11/14 2004  TROPONINI <0.03 <0.03   BNP (last 3 results) No results for input(s): PROBNP in the last 8760 hours.   CBG:  Recent Labs Lab 10/12/14 0818 10/12/14 1253 10/12/14 1802 10/12/14 2236 10/13/14 0752  GLUCAP 86 94 143* 173* 187*    Recent Results (from the past 240 hour(s))  MRSA PCR Screening     Status: None   Collection Time: 10/11/14  7:35 PM  Result Value Ref Range Status   MRSA by PCR NEGATIVE NEGATIVE Final    Comment:        The GeneXpert MRSA Assay (FDA approved for NASAL specimens only), is one component of a comprehensive MRSA  colonization surveillance program. It is not intended to diagnose MRSA infection nor to guide or monitor treatment for MRSA infections.      Studies: Ct Abdomen Pelvis Wo Contrast  09/15/2014   CLINICAL DATA:  Gross hematuria, anemia, history kidney stones, hypertension, diabetes, atrial fibrillation, COPD  EXAM: CT ABDOMEN AND PELVIS WITHOUT CONTRAST  TECHNIQUE: Multidetector CT imaging of the abdomen and pelvis was performed following the standard protocol without IV contrast.  COMPARISON:  09/25/2005  FINDINGS: Bibasilar atelectasis and small LEFT pleural effusion.  Aneurysmal dilatation of the descending thoracic aorta, 5.7 cm transverse at the proximal descending aorta, 6.0 x 5.8 cm diameter at mid descending aorta, and 5.0 cm at distal ascending aorta.  Scattered atherosclerotic changes throughout abdominal aorta, iliac and coronary arteries.  No evidence of intramural hematoma or perianeurysmal hemorrhage.  BILATERAL minimal collecting system dilatation with significant chronic perinephric and peripelvic edema at both kidneys.  Tiny nonobstructing BILATERAL renal calculi.  No definite ureteral calcification or dilatation.  Gallbladder surgically absent.  Liver, spleen, pancreas, and adrenal glands unremarkable for technique.  Normal appendix.  Foley catheter decompresses urinary bladder with note of a tiny amount of high attenuation material adjacent to the Foley catheter balloon, question tiny bladder calculus.  Distal colonic diverticulosis without evidence of diverticulitis.  Stomach and bowel loops otherwise grossly unremarkable for technique.  Low-attenuation circulating blood question anemia.  Small supraumbilical hernia containing fat.  Tiny umbilical and RIGHT inguinal hernia containing fat.  Bones demineralized with multilevel degenerative disc disease changes of the thoracolumbar spine.  IMPRESSION: Significant atherosclerotic changes with aneurysmal dilatation of the descending thoracic  aorta achieving greatest size of 6.0 x 5.8 cm at the mid descending aorta.  No definite evidence of intramural hematoma or perianeurysmal hemorrhage.  Minimal chronic collecting system dilatation in both kidneys with tiny nonobstructing calculi and chronic infiltration of peripelvic and perinephric fat planes, stable since 2006, question sequela of previous inflammation or hemorrhage.  Questionable small bladder calculus.  Supraumbilical, umbilical and RIGHT inguinal hernias containing fat.  Sigmoid diverticulosis without diverticulitis.   Electronically Signed   By: Ulyses Southward M.D.   On: 09/15/2014 16:06   Dg Chest 2 View  10/11/2014   CLINICAL DATA:  Shortness of breath.  History of COPD.  EXAM: CHEST  2 VIEW  COMPARISON:  09/12/2014  FINDINGS: Patient is post median sternotomy. Cardiomegaly and tortuous thoracic aorta is again seen. There is mild vascular congestion and question upper lobe pulmonary edema. Bilateral pleural effusions, left greater than right, with associated bibasilar airspace disease.  IMPRESSION: 1. Vascular congestion and question upper lobe pulmonary edema. In combination with bilateral pleural effusions, question CHF. 2. Cardiomegaly and tortuous thoracic aorta, not significantly changed from prior.   Electronically Signed   By: Rubye Oaks M.D.   On: 10/11/2014 00:21   Dg Chest Port 1 View  10/12/2014   CLINICAL DATA:  81 year old with congestive heart failure, coronary artery disease  EXAM: PORTABLE CHEST - 1 VIEW  COMPARISON:  10/11/2014 and earlier.  FINDINGS: The patient is slightly rotated. Median sternotomy wires and surgical clips are present.  The cardiac silhouette is enlarged. The mediastinal contours are unchanged. The thoracic aorta appears ectatic, similar to prior.  Patchy areas of consolidation are noted bilaterally, predominantly involving the left lung base. The left costophrenic angle is obscured by opacity. No large pleural effusions are identified. There is  no pneumothorax. There is mild prominence of the interstitial markings.  No acute osseous abnormality is identified.  IMPRESSION: 1. Cardiomegaly with probable mild interstitial edema. 2. Patchy areas consolidation are suspicious for pneumonitis.   Electronically Signed   By: Fannie Knee   On: 10/12/2014 10:47   Dg Chest Port 1v Same Day  10/11/2014   CLINICAL DATA:  Shortness of breath, low oxygen saturation, congestion, history hypertension, diabetes, COPD, CHF, atrial fibrillation  EXAM: PORTABLE CHEST - 1 VIEW SAME DAY  COMPARISON:  Portable exam 2006 hr compared to 10/10/2014  FINDINGS: Enlargement of cardiac silhouette post median sternotomy.  Tortuous aorta with atherosclerotic calcification.  Rotated to the RIGHT.  Partial atelectasis in RIGHT upper lobe.  Infiltrate LEFT lower lobe.  Small pleural effusions seen on previous exam radiographically inapparent.  No pneumothorax.  Bones demineralized.  IMPRESSION: Enlargement of cardiac silhouette.  Increased atelectasis in RIGHT upper lobe with mild LEFT lower lobe infiltrate.   Electronically Signed   By: Ulyses Southward M.D.   On: 10/11/2014 23:28    Scheduled Meds: . antiseptic oral rinse  7 mL  Mouth Rinse q12n4p  . chlorhexidine  15 mL Mouth Rinse BID  . diltiazem  360 mg Oral Daily  . furosemide  60 mg Intravenous Q12H  . levETIRAcetam  500 mg Intravenous Q12H  . methylPREDNISolone (SOLU-MEDROL) injection  80 mg Intravenous Q12H  . pantoprazole (PROTONIX) IV  40 mg Intravenous Q24H  . piperacillin-tazobactam (ZOSYN)  IV  3.375 g Intravenous Q8H   Continuous Infusions:    Principal Problem:   Acute CHF (congestive heart failure) Active Problems:   Chronic atrial fibrillation   Anemia   Diabetes mellitus due to underlying condition with hyperglycemia   Palliative care encounter   Weakness generalized   Respiratory failure, acute    Time spent: 40 minutes   [facility]  [facility] Pager [phone]. If 7PM-7AM, please  contact night-coverage at [email or web address], password [facility] 10/13/2014, 12:12 PM  LOS: 3 days

## 2014-10-14 LAB — BASIC METABOLIC PANEL
Anion gap: 21 — ABNORMAL HIGH (ref 5–15)
BUN: 32 mg/dL — ABNORMAL HIGH (ref 6–23)
CO2: 38 mmol/L — AB (ref 19–32)
Calcium: 8.5 mg/dL (ref 8.4–10.5)
Chloride: 87 mEq/L — ABNORMAL LOW (ref 96–112)
Creatinine, Ser: 1.56 mg/dL — ABNORMAL HIGH (ref 0.50–1.35)
GFR calc Af Amer: 45 mL/min — ABNORMAL LOW (ref >90)
GFR calc non Af Amer: 39 mL/min — ABNORMAL LOW (ref >90)
GLUCOSE: 220 mg/dL — AB (ref 70–99)
Potassium: 3.6 mmol/L (ref 3.5–5.1)
Sodium: 146 mmol/L — ABNORMAL HIGH (ref 135–145)

## 2014-10-14 LAB — CBC
HEMATOCRIT: 31 % — AB (ref 39.0–52.0)
Hemoglobin: 9.3 g/dL — ABNORMAL LOW (ref 13.0–17.0)
MCH: 24.5 pg — ABNORMAL LOW (ref 26.0–34.0)
MCHC: 30 g/dL (ref 30.0–36.0)
MCV: 81.8 fL (ref 78.0–100.0)
Platelets: 232 10*3/uL (ref 150–400)
RBC: 3.79 MIL/uL — AB (ref 4.22–5.81)
RDW: 17.8 % — ABNORMAL HIGH (ref 11.5–15.5)
WBC: 7.5 10*3/uL (ref 4.0–10.5)

## 2014-10-14 MED ORDER — MORPHINE SULFATE (CONCENTRATE) 10 MG/0.5ML PO SOLN: 5.0000 mg | ORAL | Status: AC | PRN

## 2014-10-14 MED ORDER — HALOPERIDOL LACTATE 2 MG/ML PO CONC: 2.0000 mg | Freq: Three times a day (TID) | ORAL | Status: DC | PRN

## 2014-10-14 MED ORDER — HALOPERIDOL LACTATE 2 MG/ML PO CONC: 2.0000 mg | Freq: Three times a day (TID) | ORAL | Status: AC | PRN

## 2014-10-14 NOTE — Discharge Summary (Addendum)
Physician Discharge Summary  Glen Miller MRN: [id number] DOB/AGE: 1928-05-17 81 y.o.  PCP: Wenda Low, MD   Admit date: 10/10/2014 Discharge date: 10/14/2014  Discharge Diagnoses:  Patient being discharged home with hospice   Acute CHF (congestive heart failure) Active Problems:   Chronic atrial fibrillation   Anemia   Diabetes mellitus due to underlying condition with hyperglycemia   Palliative care encounter   Weakness generalized   Respiratory failure, acute   Agitation   SOB (shortness of breath)   Follow-up recommendations Patient being discharged with home hospice No repeat hospitalizations,  on antibiotics, artificial nutrition Patient is a DNR/DNI    Medication List    STOP taking these medications        CALCIUM 600/VITAMIN D 600-400 MG-UNIT per tablet  Generic drug:  Calcium Carbonate-Vitamin D     celecoxib 200 MG capsule  Commonly known as:  CELEBREX     cetirizine 10 MG tablet  Commonly known as:  ZYRTEC     FLUoxetine 20 MG capsule  Commonly known as:  PROZAC     gabapentin 400 MG capsule  Commonly known as:  NEURONTIN     hydrOXYzine 25 MG tablet  Commonly known as:  ATARAX/VISTARIL     insulin glargine 100 UNIT/ML injection  Commonly known as:  LANTUS     isosorbide mononitrate 30 MG 24 hr tablet  Commonly known as:  IMDUR     LORazepam 1 MG tablet  Commonly known as:  ATIVAN     magnesium oxide 400 MG tablet  Commonly known as:  MAG-OX     memantine 10 MG tablet  Commonly known as:  NAMENDA     metFORMIN 500 MG tablet  Commonly known as:  GLUCOPHAGE     nitroGLYCERIN 0.4 MG/SPRAY spray  Commonly known as:  NITROLINGUAL     oxyCODONE-acetaminophen 5-325 MG per tablet  Commonly known as:  PERCOCET/ROXICET     potassium chloride SA 20 MEQ tablet  Commonly known as:  K-DUR,KLOR-CON     traMADol 50 MG tablet  Commonly known as:  ULTRAM      TAKE these medications        acetaminophen 500 MG tablet  Commonly  known as:  TYLENOL  Take 1,000 mg by mouth every 6 (six) hours as needed. For pain     diltiazem 360 MG 24 hr capsule  Commonly known as:  CARDIZEM CD  Take 1 capsule (360 mg total) by mouth daily.     furosemide 40 MG tablet  Commonly known as:  LASIX  Take 60 mg by mouth daily.     haloperidol 2 MG/ML solution  Commonly known as:  HALDOL  Take 1 mL (2 mg total) by mouth every 8 (eight) hours as needed for agitation.     ipratropium-albuterol 0.5-2.5 (3) MG/3ML Soln  Commonly known as:  DUONEB  Take 3 mLs by nebulization 3 (three) times daily as needed (sortness of breath).     levETIRAcetam 500 MG tablet  Commonly known as:  KEPPRA  Take 1 tablet (500 mg total) by mouth 2 (two) times daily.     morphine CONCENTRATE 10 MG/0.5ML Soln concentrated solution  Take 0.25 mLs (5 mg total) by mouth every hour as needed for moderate pain or shortness of breath (dyspnea, RR > 25).     omeprazole 20 MG capsule  Commonly known as:  PRILOSEC  Take 20 mg by mouth daily.     promethazine 25 MG tablet  Commonly known as:  PHENERGAN  Take 25 mg by mouth every 6 (six) hours as needed for nausea.     tamsulosin 0.4 MG Caps capsule  Commonly known as:  FLOMAX  Take 0.4 mg by mouth every evening.        Discharge Condition:    Disposition: Home with hospice   Consults:  Palliative care  Significant Diagnostic Studies: Ct Abdomen Pelvis Wo Contrast  09/15/2014   CLINICAL DATA:  Gross hematuria, anemia, history kidney stones, hypertension, diabetes, atrial fibrillation, COPD  EXAM: CT ABDOMEN AND PELVIS WITHOUT CONTRAST  TECHNIQUE: Multidetector CT imaging of the abdomen and pelvis was performed following the standard protocol without IV contrast.  COMPARISON:  09/25/2005  FINDINGS: Bibasilar atelectasis and small LEFT pleural effusion.  Aneurysmal dilatation of the descending thoracic aorta, 5.7 cm transverse at the proximal descending aorta, 6.0 x 5.8 cm diameter at mid descending  aorta, and 5.0 cm at distal ascending aorta.  Scattered atherosclerotic changes throughout abdominal aorta, iliac and coronary arteries.  No evidence of intramural hematoma or perianeurysmal hemorrhage.  BILATERAL minimal collecting system dilatation with significant chronic perinephric and peripelvic edema at both kidneys.  Tiny nonobstructing BILATERAL renal calculi.  No definite ureteral calcification or dilatation.  Gallbladder surgically absent.  Liver, spleen, pancreas, and adrenal glands unremarkable for technique.  Normal appendix.  Foley catheter decompresses urinary bladder with note of a tiny amount of high attenuation material adjacent to the Foley catheter balloon, question tiny bladder calculus.  Distal colonic diverticulosis without evidence of diverticulitis.  Stomach and bowel loops otherwise grossly unremarkable for technique.  Low-attenuation circulating blood question anemia.  Small supraumbilical hernia containing fat.  Tiny umbilical and RIGHT inguinal hernia containing fat.  Bones demineralized with multilevel degenerative disc disease changes of the thoracolumbar spine.  IMPRESSION: Significant atherosclerotic changes with aneurysmal dilatation of the descending thoracic aorta achieving greatest size of 6.0 x 5.8 cm at the mid descending aorta.  No definite evidence of intramural hematoma or perianeurysmal hemorrhage.  Minimal chronic collecting system dilatation in both kidneys with tiny nonobstructing calculi and chronic infiltration of peripelvic and perinephric fat planes, stable since 2006, question sequela of previous inflammation or hemorrhage.  Questionable small bladder calculus.  Supraumbilical, umbilical and RIGHT inguinal hernias containing fat.  Sigmoid diverticulosis without diverticulitis.   Electronically Signed   By: Lavonia Dana M.D.   On: 09/15/2014 16:06   Dg Chest 2 View  10/11/2014   CLINICAL DATA:  Shortness of breath.  History of COPD.  EXAM: CHEST  2 VIEW   COMPARISON:  09/12/2014  FINDINGS: Patient is post median sternotomy. Cardiomegaly and tortuous thoracic aorta is again seen. There is mild vascular congestion and question upper lobe pulmonary edema. Bilateral pleural effusions, left greater than right, with associated bibasilar airspace disease.  IMPRESSION: 1. Vascular congestion and question upper lobe pulmonary edema. In combination with bilateral pleural effusions, question CHF. 2. Cardiomegaly and tortuous thoracic aorta, not significantly changed from prior.   Electronically Signed   By: Jeb Levering M.D.   On: 10/11/2014 00:21   Dg Chest Port 1 View  10/12/2014   CLINICAL DATA:  80 year old with congestive heart failure, coronary artery disease  EXAM: PORTABLE CHEST - 1 VIEW  COMPARISON:  10/11/2014 and earlier.  FINDINGS: The patient is slightly rotated. Median sternotomy wires and surgical clips are present.  The cardiac silhouette is enlarged. The mediastinal contours are unchanged. The thoracic aorta appears ectatic, similar to prior.  Patchy areas of consolidation  are noted bilaterally, predominantly involving the left lung base. The left costophrenic angle is obscured by opacity. No large pleural effusions are identified. There is no pneumothorax. There is mild prominence of the interstitial markings.  No acute osseous abnormality is identified.  IMPRESSION: 1. Cardiomegaly with probable mild interstitial edema. 2. Patchy areas consolidation are suspicious for pneumonitis.   Electronically Signed   By: Rosemarie Ax   On: 10/12/2014 10:47   Dg Chest Port 1v Same Day  10/11/2014   CLINICAL DATA:  Shortness of breath, low oxygen saturation, congestion, history hypertension, diabetes, COPD, CHF, atrial fibrillation  EXAM: PORTABLE CHEST - 1 VIEW SAME DAY  COMPARISON:  Portable exam 2006 hr compared to 10/10/2014  FINDINGS: Enlargement of cardiac silhouette post median sternotomy.  Tortuous aorta with atherosclerotic calcification.  Rotated to  the RIGHT.  Partial atelectasis in RIGHT upper lobe.  Infiltrate LEFT lower lobe.  Small pleural effusions seen on previous exam radiographically inapparent.  No pneumothorax.  Bones demineralized.  IMPRESSION: Enlargement of cardiac silhouette.  Increased atelectasis in RIGHT upper lobe with mild LEFT lower lobe infiltrate.   Electronically Signed   By: Lavonia Dana M.D.   On: 10/11/2014 23:28   X    Microbiology: Recent Results (from the past 240 hour(s))  MRSA PCR Screening     Status: None   Collection Time: 10/11/14  7:35 PM  Result Value Ref Range Status   MRSA by PCR NEGATIVE NEGATIVE Final    Comment:        The GeneXpert MRSA Assay (FDA approved for NASAL specimens only), is one component of a comprehensive MRSA colonization surveillance program. It is not intended to diagnose MRSA infection nor to guide or monitor treatment for MRSA infections.      Labs: Results for orders placed or performed during the hospital encounter of 10/10/14 (from the past 48 hour(s))  Glucose, capillary     Status: None   Collection Time: 10/12/14 12:53 PM  Result Value Ref Range   Glucose-Capillary 94 70 - 99 mg/dL   Comment 1 Capillary Sample   Glucose, capillary     Status: Abnormal   Collection Time: 10/12/14  6:02 PM  Result Value Ref Range   Glucose-Capillary 143 (H) 70 - 99 mg/dL   Comment 1 Capillary Sample   Glucose, capillary     Status: Abnormal   Collection Time: 10/12/14 10:36 PM  Result Value Ref Range   Glucose-Capillary 173 (H) 70 - 99 mg/dL  Comprehensive metabolic panel     Status: Abnormal   Collection Time: 10/13/14  2:13 AM  Result Value Ref Range   Sodium 141 135 - 145 mmol/L    Comment: Please note change in reference range.   Potassium 3.7 3.5 - 5.1 mmol/L    Comment: Please note change in reference range.   Chloride 87 (L) 96 - 112 mEq/L   CO2 35 (H) 19 - 32 mmol/L   Glucose, Bld 193 (H) 70 - 99 mg/dL   BUN 27 (H) 6 - 23 mg/dL   Creatinine, Ser 1.65 (H)  0.50 - 1.35 mg/dL   Calcium 8.4 8.4 - 10.5 mg/dL   Total Protein 5.9 (L) 6.0 - 8.3 g/dL   Albumin 2.8 (L) 3.5 - 5.2 g/dL   AST 14 0 - 37 U/L   ALT 10 0 - 53 U/L   Alkaline Phosphatase 66 39 - 117 U/L   Total Bilirubin 1.4 (H) 0.3 - 1.2 mg/dL   GFR calc  non Af Amer 37 (L) >90 mL/min   GFR calc Af Amer 42 (L) >90 mL/min    Comment: (NOTE) The eGFR has been calculated using the CKD EPI equation. This calculation has not been validated in all clinical situations. eGFR's persistently <90 mL/min signify possible Chronic Kidney Disease.    Anion gap 19 (H) 5 - 15  CBC     Status: Abnormal   Collection Time: 10/13/14  2:13 AM  Result Value Ref Range   WBC 5.2 4.0 - 10.5 K/uL   RBC 3.70 (L) 4.22 - 5.81 MIL/uL   Hemoglobin 9.0 (L) 13.0 - 17.0 g/dL   HCT 30.7 (L) 39.0 - 52.0 %   MCV 83.0 78.0 - 100.0 fL   MCH 24.3 (L) 26.0 - 34.0 pg   MCHC 29.3 (L) 30.0 - 36.0 g/dL   RDW 17.7 (H) 11.5 - 15.5 %   Platelets 210 150 - 400 K/uL  Glucose, capillary     Status: Abnormal   Collection Time: 10/13/14  7:52 AM  Result Value Ref Range   Glucose-Capillary 187 (H) 70 - 99 mg/dL   Comment 1 Capillary Sample   Basic metabolic panel     Status: Abnormal   Collection Time: 10/14/14  3:57 AM  Result Value Ref Range   Sodium 146 (H) 135 - 145 mmol/L    Comment: Please note change in reference range.   Potassium 3.6 3.5 - 5.1 mmol/L    Comment: Please note change in reference range.   Chloride 87 (L) 96 - 112 mEq/L   CO2 38 (H) 19 - 32 mmol/L   Glucose, Bld 220 (H) 70 - 99 mg/dL   BUN 32 (H) 6 - 23 mg/dL   Creatinine, Ser 1.56 (H) 0.50 - 1.35 mg/dL   Calcium 8.5 8.4 - 10.5 mg/dL   GFR calc non Af Amer 39 (L) >90 mL/min   GFR calc Af Amer 45 (L) >90 mL/min    Comment: (NOTE) The eGFR has been calculated using the CKD EPI equation. This calculation has not been validated in all clinical situations. eGFR's persistently <90 mL/min signify possible Chronic Kidney Disease.    Anion gap 21 (H) 5 -  15  CBC     Status: Abnormal   Collection Time: 10/14/14  3:57 AM  Result Value Ref Range   WBC 7.5 4.0 - 10.5 K/uL   RBC 3.79 (L) 4.22 - 5.81 MIL/uL   Hemoglobin 9.3 (L) 13.0 - 17.0 g/dL   HCT 31.0 (L) 39.0 - 52.0 %   MCV 81.8 78.0 - 100.0 fL   MCH 24.5 (L) 26.0 - 34.0 pg   MCHC 30.0 30.0 - 36.0 g/dL   RDW 17.8 (H) 11.5 - 15.5 %   Platelets 232 150 - 400 K/uL     HPI :Glen Miller is a 81 y.o. male who presents to the ED with c/o SOB. Symptoms are associated with cough but patient is a very poor historian and dosent notice much. Family denies known CP, fever, N/V/D. He has had cough which sounds productive. Patient is regularly on 2L home O2 AT baseline for COPD, at home patients O2 sats gradually decreased today. They called his PCP who told them to increase his O2 at home. Sats reportedly improved to 90% but no higher so family brought him in to the ED for being more lethargic than baseline. Patient has had recent multiple hospitalizations in December 2015 for aspiration pneumonia. Patient also has history of advanced COPD  HOSPITAL COURSE:  Assessment and plan 1. Acute respiratory hypercapnic failure secondary to Acute CHF /COPD exacerbation in the setting of possible pneumonia- due to worsening symptoms the patient was transferred to step down unit   and placed on BiPAP. Subsequently transitioned to high flow nasal cannula. Discussed with the patient's wife and 2 daughters, patient is at high risk of recurrent aspiration. Patient is noncompliant with CPap at home. Patient has had multiple hospitalizations for the same. Family is agreeable to palliative care consultation and taking patient home on hospice. They confirmed that the patient is a DO NOT RESUSCITATE. Continue Lasix for comfort 2. No need for BMP monitoring of the patient is going home on hospice 3. 2D echo shows ejection fraction of 47% with diastolic dysfunction, moderate mitral regurgitation, possible vegetation on  the tricuspid valve, and cardiologist recommends TEE, severe pulmonary hypertension, discussed with Juliann Pulse the patient's daughter, they do not want any further workup 4. palliative care consultation for hospice eligibility, deemed appropriate for hospice, patient already started on Roxanol 5. Comfort feeding with the understanding of the risk of aspiration 6. Discontinue Solu-Medrol for COPD exacerbation, discontinue antibiotics, discontinue Accu-Cheks    2. A.Flutter - (has long history of chronic a.fib at baseline) 1. Controlled rate 2. Continue by mouth Cardizem for comfort   3. Anemia -  1. No further labs 2. Was due to gross hematuria back in Dec. 3. Hematuria has resolved   Goals of care 2. Code Status: DNR 3. Symptom Control: 1. Anxiety/agitation: Haldol 2 mg every 6 hours prn. He does not tolerate ativan per family. Will trial haldol. 2. Pain/dyspnea/SOB: Roxanol 5 mg every hour prn.   History of generalized seizure Evaluated during last admission, continue Keppra  Hypertension Blood pressure soft Discontinue lisinopril  Metabolic encephalopathy Secondary to hypercapnic respiratory failure Discontinued cetirizine, Prozac, gabapentin, hydroxyzine, Ativan, Percocet, tramadol Prn haldol   Reason history of aspiration pneumonia Patient has completed his course of antibiotic no indication for repeat antibiotics at this time  History of AAA Recent CT of the abdomen and pelvis with descending thoracic aortic aneurysm of 6.0 x 5.8 cm, no definite evidence of intramural hematoma   Diabetes hold metformin Discontinued NovoLog SSI and Lantus   Discharge Exam:    Blood pressure 141/86, pulse 82, temperature 97.7 F (36.5 C), temperature source Oral, resp. rate 20, height 5' 10"  (1.778 m), weight 93.2 kg (205 lb 7.5 oz), SpO2 96 %.  4. General: Lethargic, somnolent 5. Cardiovascular: RRR, nl S1 s2  6. Respiratory: Decreased breath sounds at the bases, scattered  rhonchi, no crackles  7. Abdomen: soft +BS NT/ND, no masses palpable  8. Extremities: No cyanosis and no edema 9.         SignedReyne Dumas 10/14/2014, 12:06 PM

## 2014-10-14 NOTE — Progress Notes (Signed)
Took Pt off BIPAP due to poor toleration. He is now on [id number] with sat's of 96%. Tolerating the [place] well at this time.

## 2014-10-14 NOTE — Progress Notes (Signed)
10/14/2014 3:09 PM  Pt being discharged soon to home hospice care. DNR. Very agitated. Removed from monitor and IV removed. Glen Miller, Glen Miller

## 2014-10-14 NOTE — Progress Notes (Signed)
Follow-up: pt seen at bedside, daughter Glen Miller, PMT NP Glen Miller and staff RN Glen Miller present; pt more alert this morning able to take some sips of juice and small bites of oatmeal per daughter; pt somewhat restless states 'I need to pee'; Foley catheter in place; once reminded pt then settles and drifts off to sleep; family wishes to leave foley in at discharge for comfort; Daughter informed [facility] called pt's home and plan is for DME requested to be at the home today between 11-3; pt's wife to call daughter at bedside when [facility] so that staff on unit can begin to arrange for transport, etc. Daughter voiced her concern about pt's comfort/restlessness especially with transport; Clinical research associatewriter spoke with Staff RN Glen Miller to request pt be assessed at time transport is called and possibly receive dose of PRN medication for comfort and to lessen anxiety of transfer.  Discussed with daughter that [facility] of discharge today and Admission RN currently scheduled to see pt/family this evening at 6 pm. Please call with any questions Glen DavidMargie [name], RN MSN [facility] Liaison [phone]

## 2014-10-14 NOTE — Progress Notes (Signed)
10/14/2014 6:23 PM Discharged home via [facility] with foley. Pt DNR with Hospice care. Hospice notified and family notified of transport is here and on way home. Pt was given 5 mg of oral Morphine prior to discharge per request of hospice. Agitation decreased. AmeLie Hollars, Linnell Fullingose Burnett

## 2014-10-14 NOTE — Progress Notes (Signed)
CSW (Clinical Child psychotherapistocial Worker) notified that pt still awaiting equipment delivery. CSW prepared medical necessity form for transport. CSW spoke with pt nurse who confirmed address. Pt nurse provided with [facility] phone number and agreeable to arranging transport when equipment is delivered. Pt has no further social work needs. CSW signing off.  Shaquira Moroz, LCSWA [phone]

## 2014-10-14 NOTE — Progress Notes (Signed)
Progress Note from the Palliative Medicine Team at [facility]: I met this morning with Glen Miller and daughter, Butch Penny, at bedside. I am happy that he is alert, on nasal cannula and looks the best I have seen him since I have been following - I believe today is the ideal time to get him home with hospice on board. Unfortunately, there seems to be a delay in delivery of equipment for the home but this is being dealt with. I would continue roxanol and haldol as needed and may increase frequency/dosage as needed. Will order regular diet for comfort as he is more awake. Family is anxious to get him home.    Objective: Allergies  Allergen Reactions  . Ativan [Lorazepam] Anxiety    Increases agitation per daughter    Scheduled Meds: . antiseptic oral rinse  7 mL Mouth Rinse q12n4p  . chlorhexidine  15 mL Mouth Rinse BID  . diltiazem  360 mg Oral Daily  . enoxaparin (LOVENOX) injection  40 mg Subcutaneous Q24H  . furosemide  60 mg Intravenous Q12H  . levETIRAcetam  500 mg Intravenous Q12H  . pantoprazole (PROTONIX) IV  40 mg Intravenous Q24H   Continuous Infusions:  PRN Meds:.acetaminophen, diltiazem, haloperidol, ipratropium-albuterol, morphine CONCENTRATE, nitroGLYCERIN, ondansetron (ZOFRAN) IV  BP 141/86 mmHg  Pulse 82  Temp(Src) 97.7 F (36.5 C) (Oral)  Resp 20  Ht _0  (1.778 m)  Wt 93.2 kg (205 lb 7.5 oz)  BMI 29.48 kg/m2  SpO2 96%   PPS: 20% at best   Intake/Output Summary (Last 24 hours) at 10/14/14 1213 Last data filed at 10/14/14 1100  Gross per 24 hour  Intake    420 ml  Output   1500 ml  Net  -1080 ml      LBM: 1/13  Physical Exam:  General: NAD HEENT: [place]/AT Chest: No labored breathing, symmetric CVS: Irreg Abdomen: Soft, NT, ND Ext: MAE, BLE 2+ edema Neuro: Awake, alert, confused  Labs: CBC    Component Value Date/Time   WBC 7.5 10/14/2014 0357   RBC 3.79* 10/14/2014 0357   RBC 3.46* 09/15/2014 0340   HGB 9.3* 10/14/2014 0357   HCT  31.0* 10/14/2014 0357   PLT 232 10/14/2014 0357   MCV 81.8 10/14/2014 0357   MCH 24.5* 10/14/2014 0357   MCHC 30.0 10/14/2014 0357   RDW 17.8* 10/14/2014 0357   LYMPHSABS 0.8 10/10/2014 2327   MONOABS 0.6 10/10/2014 2327   EOSABS 0.1 10/10/2014 2327   BASOSABS 0.0 10/10/2014 2327    BMET    Component Value Date/Time   NA 146* 10/14/2014 0357   K 3.6 10/14/2014 0357   CL 87* 10/14/2014 0357   CO2 38* 10/14/2014 0357   GLUCOSE 220* 10/14/2014 0357   BUN 32* 10/14/2014 0357   CREATININE 1.56* 10/14/2014 0357   CALCIUM 8.5 10/14/2014 0357   GFRNONAA 39* 10/14/2014 0357   GFRAA 45* 10/14/2014 0357    CMP     Component Value Date/Time   NA 146* 10/14/2014 0357   K 3.6 10/14/2014 0357   CL 87* 10/14/2014 0357   CO2 38* 10/14/2014 0357   GLUCOSE 220* 10/14/2014 0357   BUN 32* 10/14/2014 0357   CREATININE 1.56* 10/14/2014 0357   CALCIUM 8.5 10/14/2014 0357   PROT 5.9* 10/13/2014 0213   ALBUMIN 2.8* 10/13/2014 0213   AST 14 10/13/2014 0213   ALT 10 10/13/2014 0213   ALKPHOS 66 10/13/2014 0213   BILITOT 1.4* 10/13/2014 0213   GFRNONAA 39*  10/14/2014 0357   GFRAA 45* 10/14/2014 0357    Assessment and Plan: 1. Code Status: DNR 2. Symptom Control: 1. Anxiety/agitation: Haldol 2 mg every 6 hours prn. Increase dosage as needed for comfort.  2. Pain/dyspnea/SOB: Roxanol 5 mg every hour prn. May increase dosage as needed for comfort.  3. Seizures: May consider scheduled valium (comes in concentrated solution) to prevent seizures. May not tolerate valium if does not tolerate ativan.  3. Psycho/Social: Emotional support provided to patient and family.  4. Disposition: Home with hospice today.    Time In Time Out Total Time Spent with Patient Total Overall Time  0920 0940 50mn 2100m    Greater than 50%  of this time was spent counseling and coordinating care related to the above assessment and plan.  AlVinie SillNP Palliative Medicine Team Pager # [phone]-F  8a-5p) Team Phone # [phone]/Weekends)

## 2014-10-26 ENCOUNTER — Telehealth: Payer: Self-pay | Admitting: Neurology

## 2014-10-26 NOTE — Telephone Encounter (Signed)
Pt passed away on 05/23/15. Pt's appt for 10/28/14 has been canceled.  This was a [facility]

## 2014-10-28 ENCOUNTER — Ambulatory Visit: Payer: Medicare Other | Admitting: Neurology

## 2014-11-01 DEATH — deceased
